# Patient Record
Sex: Female | Born: 1940 | Race: White | Hispanic: No | Smoking: Never smoker
Health system: Southern US, Community
[De-identification: ages and names within clinical notes are randomized; demographics above are authoritative.]

## PROBLEM LIST (undated history)

## (undated) DIAGNOSIS — M1990 Unspecified osteoarthritis, unspecified site: Secondary | ICD-10-CM

## (undated) DIAGNOSIS — E7800 Pure hypercholesterolemia, unspecified: Secondary | ICD-10-CM

## (undated) DIAGNOSIS — I639 Cerebral infarction, unspecified: Secondary | ICD-10-CM

## (undated) DIAGNOSIS — M810 Age-related osteoporosis without current pathological fracture: Secondary | ICD-10-CM

## (undated) DIAGNOSIS — I10 Essential (primary) hypertension: Secondary | ICD-10-CM

## (undated) DIAGNOSIS — N3281 Overactive bladder: Secondary | ICD-10-CM

## (undated) DIAGNOSIS — C50919 Malignant neoplasm of unspecified site of unspecified female breast: Secondary | ICD-10-CM

## (undated) DIAGNOSIS — C50912 Malignant neoplasm of unspecified site of left female breast: Secondary | ICD-10-CM

## (undated) DIAGNOSIS — T7840XA Allergy, unspecified, initial encounter: Secondary | ICD-10-CM

## (undated) HISTORY — DX: Unspecified osteoarthritis, unspecified site: M19.90

## (undated) HISTORY — PX: BREAST LUMPECTOMY: SHX2

## (undated) HISTORY — DX: Malignant neoplasm of unspecified site of left female breast: C50.912

## (undated) HISTORY — PX: ABDOMINAL HYSTERECTOMY: SHX81

## (undated) HISTORY — DX: Allergy, unspecified, initial encounter: T78.40XA

## (undated) HISTORY — DX: Overactive bladder: N32.81

## (undated) HISTORY — DX: Age-related osteoporosis without current pathological fracture: M81.0

## (undated) HISTORY — DX: Malignant neoplasm of unspecified site of unspecified female breast: C50.919

---

## 1999-01-14 ENCOUNTER — Other Ambulatory Visit: Admission: RE | Admit: 1999-01-14 | Discharge: 1999-01-14 | Payer: Self-pay | Admitting: *Deleted

## 2000-03-05 ENCOUNTER — Other Ambulatory Visit: Admission: RE | Admit: 2000-03-05 | Discharge: 2000-03-05 | Payer: Self-pay | Admitting: *Deleted

## 2001-04-07 ENCOUNTER — Other Ambulatory Visit: Admission: RE | Admit: 2001-04-07 | Discharge: 2001-04-07 | Payer: Self-pay | Admitting: *Deleted

## 2006-03-02 ENCOUNTER — Encounter: Admission: RE | Admit: 2006-03-02 | Discharge: 2006-03-02 | Payer: Self-pay | Admitting: Unknown Physician Specialty

## 2006-08-31 ENCOUNTER — Encounter: Admission: RE | Admit: 2006-08-31 | Discharge: 2006-08-31 | Payer: Self-pay | Admitting: Interventional Radiology

## 2006-09-07 ENCOUNTER — Encounter: Admission: RE | Admit: 2006-09-07 | Discharge: 2006-09-07 | Payer: Self-pay | Admitting: Interventional Radiology

## 2006-09-28 ENCOUNTER — Encounter: Admission: RE | Admit: 2006-09-28 | Discharge: 2006-09-28 | Payer: Self-pay | Admitting: Interventional Radiology

## 2007-02-27 IMAGING — US EM OFFICE/OP CONSULT LEVEL 3 (40)
1 series · 13 of 16 positions shown · non-contrast
Comparison: none

[Series 1: unknown · 13 of 24 slices shown]
[im 1/24]
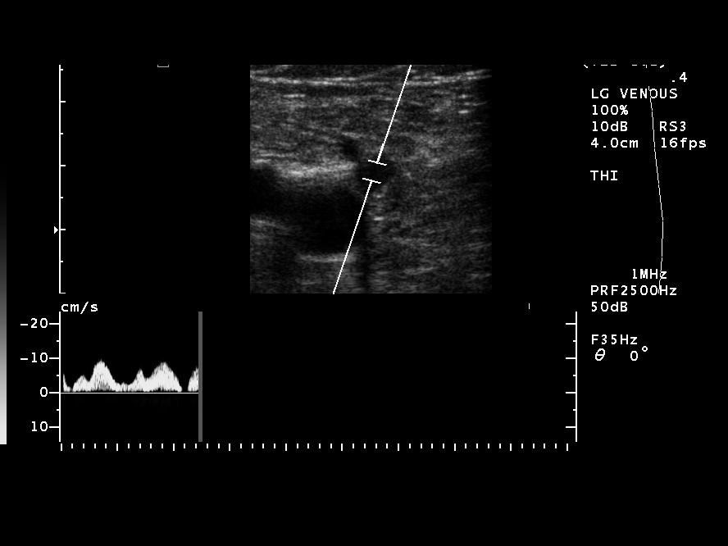
[im 2/24]
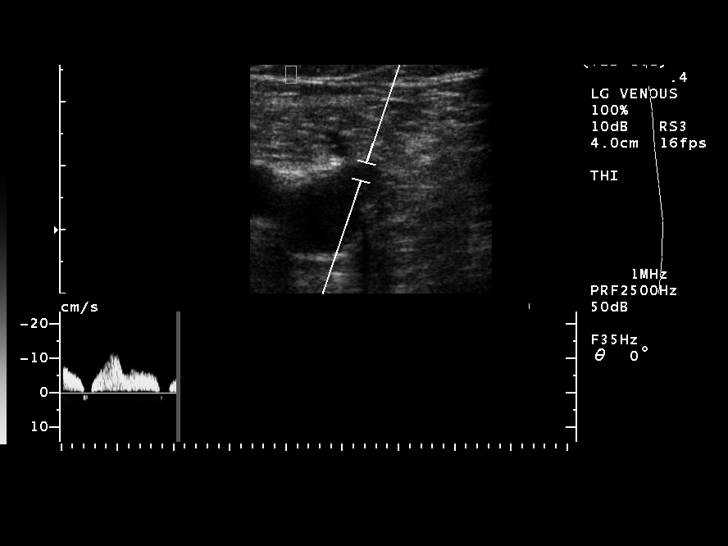
[im 5/24]
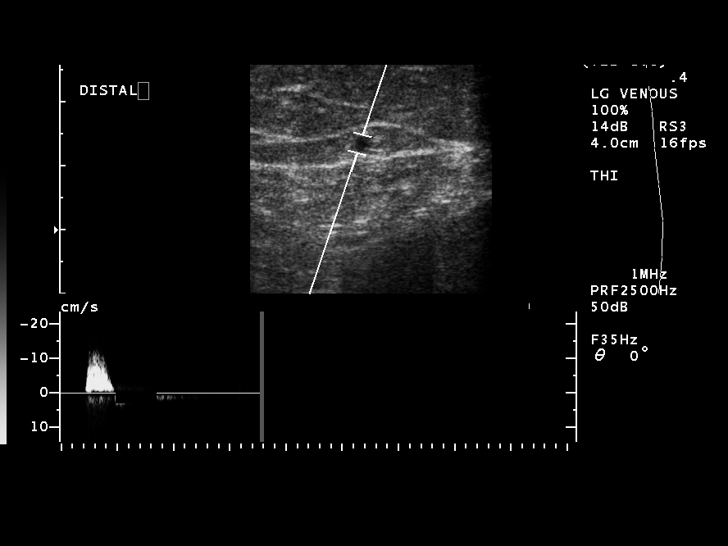
[im 7/24]
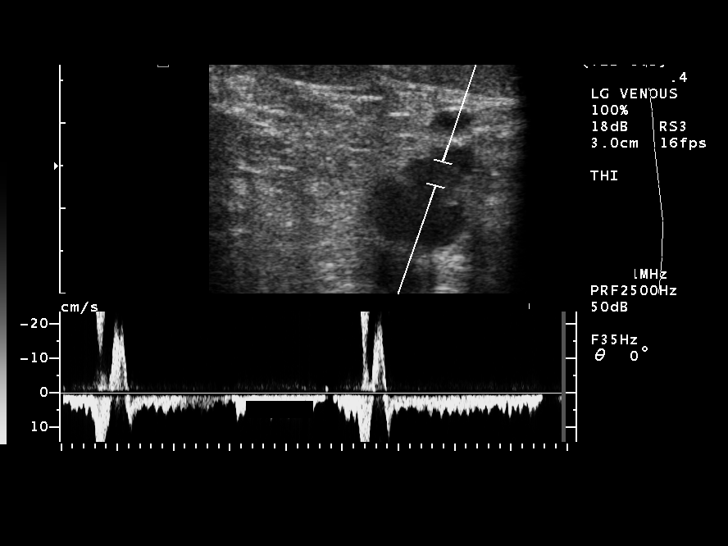
[im 8/24]
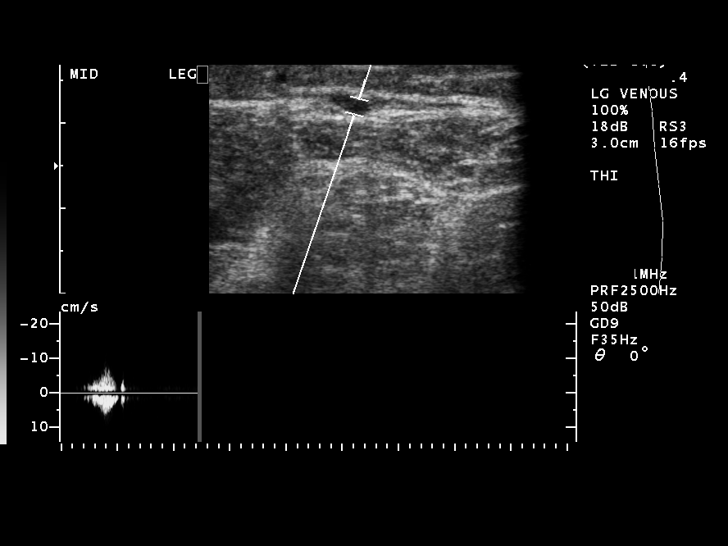
[im 10/24]
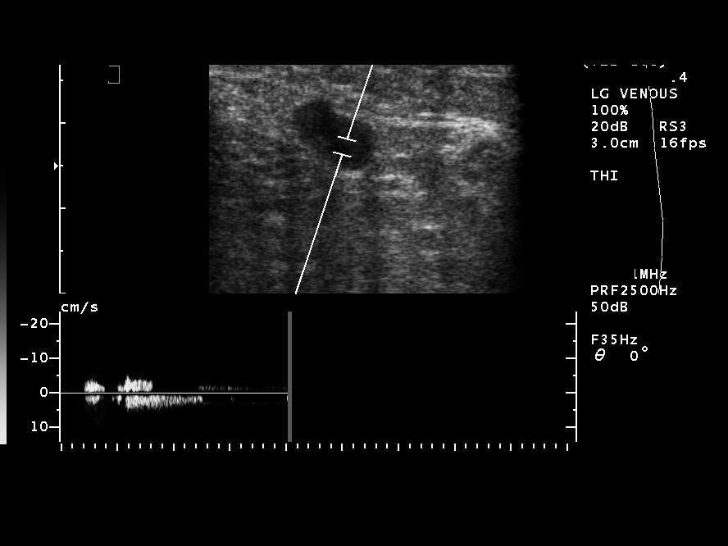
[im 13/24]
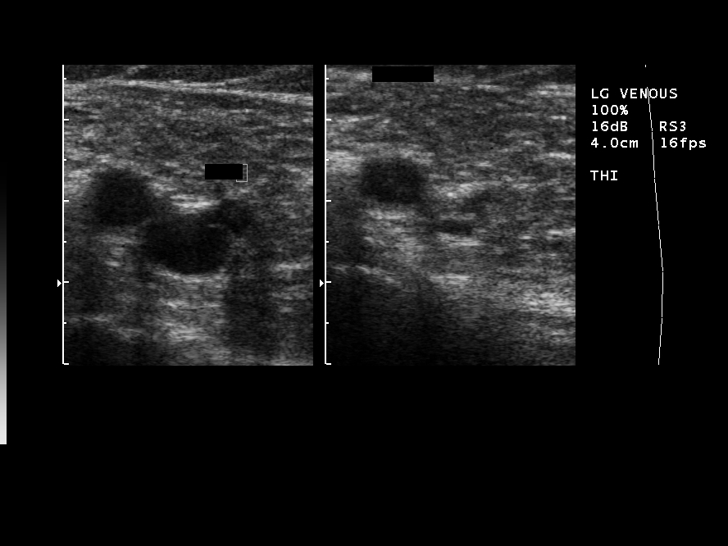
[im 14/24]
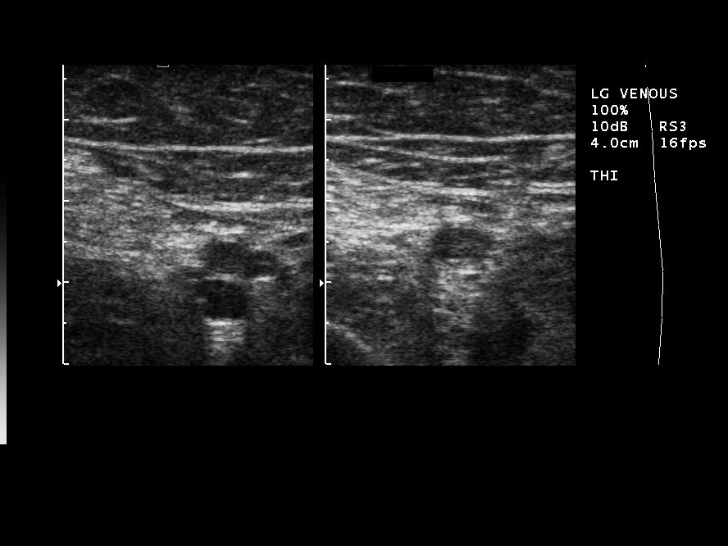
[im 16/24]
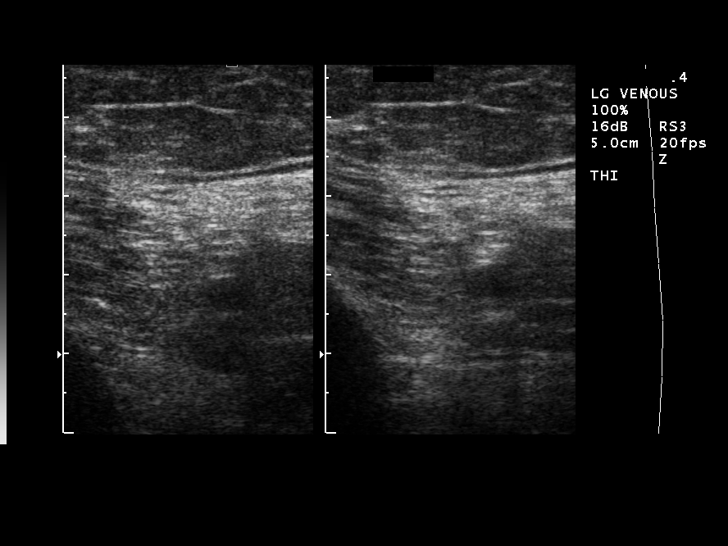
[im 17/24]
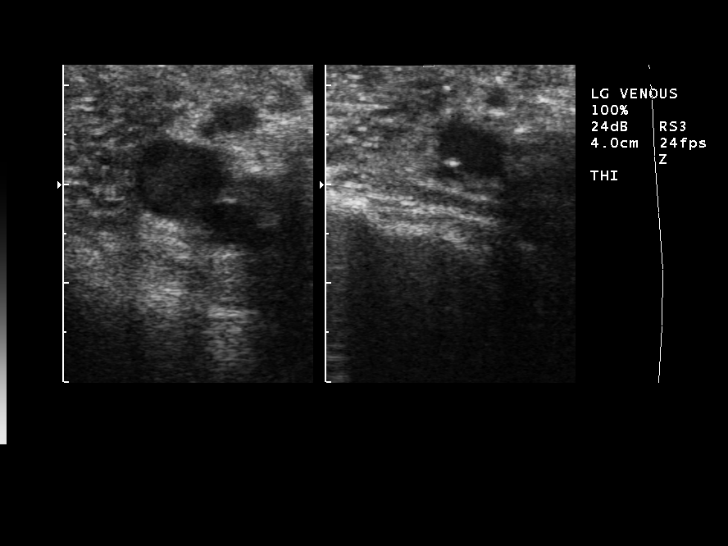
[im 19/24]
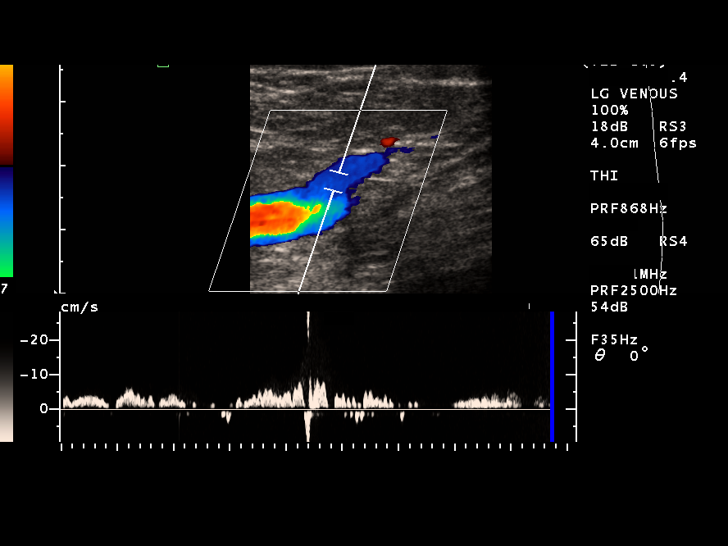
[im 22/24]
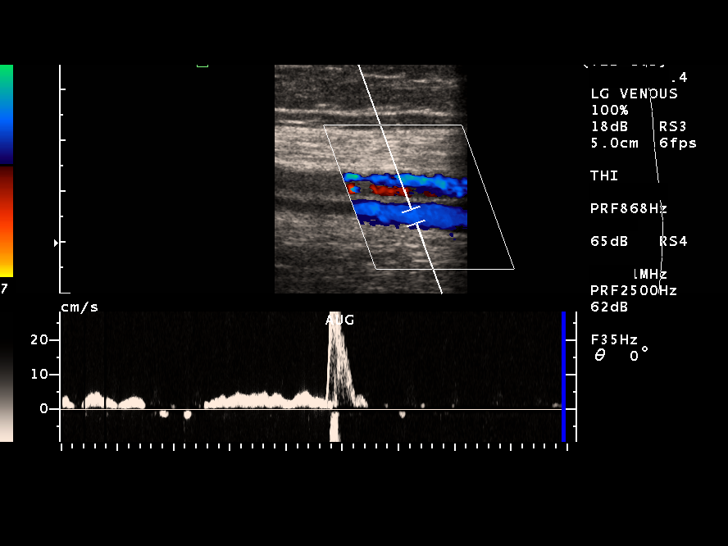
[im 24/24]
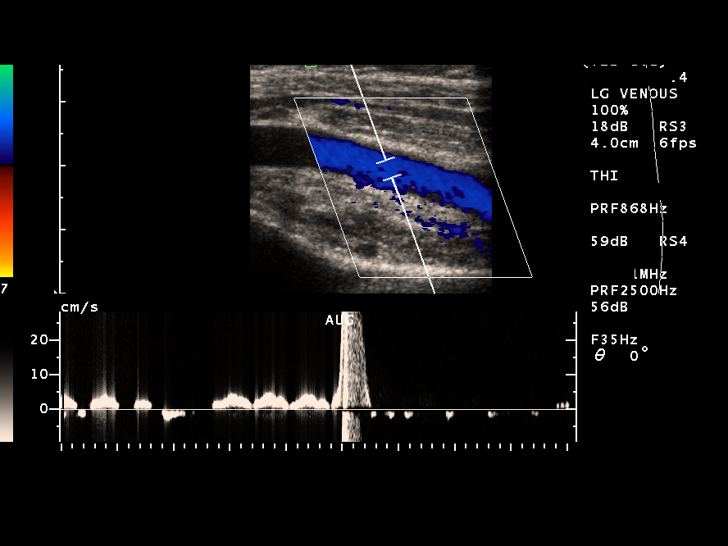

[13 of 16 positions shown; findings below may reference images not displayed]

OUTPATIENT CONSULTATION ? LEVEL III ? [DATE]

 [REDACTED] [facility]
 [address]

 RE:  Arissa Billiot (DOB ? 01/09/1941)

 Dear Dr. Caleb:  

 Thank you for referring Mrs. Jim for varicose vein evaluation.  Review of her venous history was performed.  She states she has had previous injection sclerotherapy with saline for spider veins.  She has no history of DVT or superficial thrombophlebitis.  She has a family history of varicose veins which affected her mother.  She currently is on hormone replacement therapy and takes Premarin.  She wears over-the-counter support stockings but not prescription strength graded compression hose.  She is retired.  She has a history of right hip osteoarthritis which limits her exercise.  She currently walks about 20 to 30 minutes at a time one to two times a week.  She describes worsening throbbing and achy type pain in the right calf posteriorly.  This pain has awakened her at night consistent with restless leg syndrome.  Her symptoms are worse throughout the day.  She previously was taking Celebrex for pain relief however now continues to take Ibuprofen 400mg bid approximately four times a week for pain relief.  

 On physical exam, she has medium size subsurface tortuous varicosities in the right lower thigh extending across the popliteal fossa onto the calf.  The left lower extremity is not affected.  

 Ultrasound was performed today.  Briefly this demonstrated right SSV venous insufficiency with an incompetent right SPJ.  The right GSV was normal.  There was no DVT.  

 Today, I spent approximately 45 minutes with Mrs. Jim and in detail reviewed the physical exam and ultrasound findings.  Our discussion centered around treatment of the right SSV to relieve the venous hypertension resulting in the varicose veins.  She understands the procedure, risks, benefits, goals, outcome, and recovery.  She does wish to proceed with insurance preauthorization which will be performed for her.  She was fitted today for thigh length prescription strength graded compression stockings (20 to 30mm of Hg).  

 Again, thank you for allowing me to participate in Mrs. Jim?Gambetta care.  I will keep you informed of her treatment and progress. 

 Tarla, Bambucafe, M.D.
 TSE:Jumper

 CC:  Dr. Boujema Gaouzi

## 2007-04-20 ENCOUNTER — Encounter: Admission: RE | Admit: 2007-04-20 | Discharge: 2007-04-20 | Payer: Self-pay | Admitting: Interventional Radiology

## 2007-09-25 IMAGING — US US EXTREM LOW VENOUS*R*
1 series · 14 of 24 positions shown · non-contrast
Comparison: none

CLINICAL DATA: One  month status post right SSV transcatheter laser occlusion for varicose veins.
RIGHT LOWER EXTREMITY VENOUS DOPPLER ULTRASOUND:
TECHNIQUE: Gray-scale sonography with compression as well as color and duplex Doppler ultrasound were performed to evaluate the deep venous system from the level of the common femoral vein through the popliteal and proximal calf veins.

[Series 1: unknown · 14 of 27 slices shown]
[im 1/27]
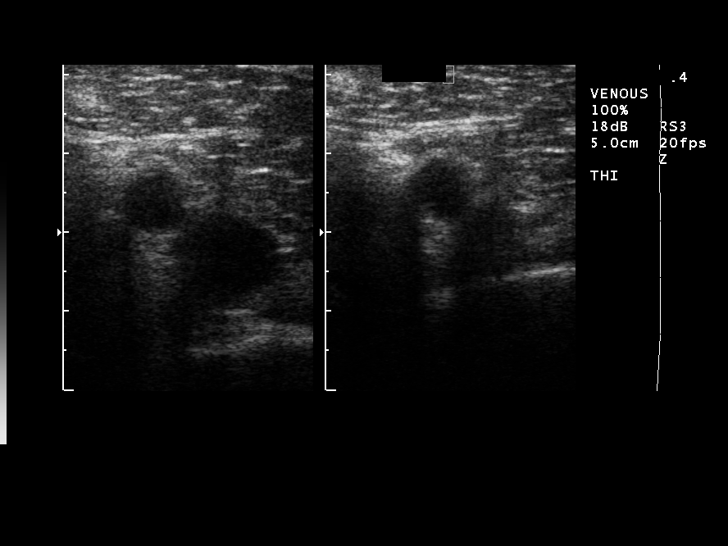
[im 3/27]
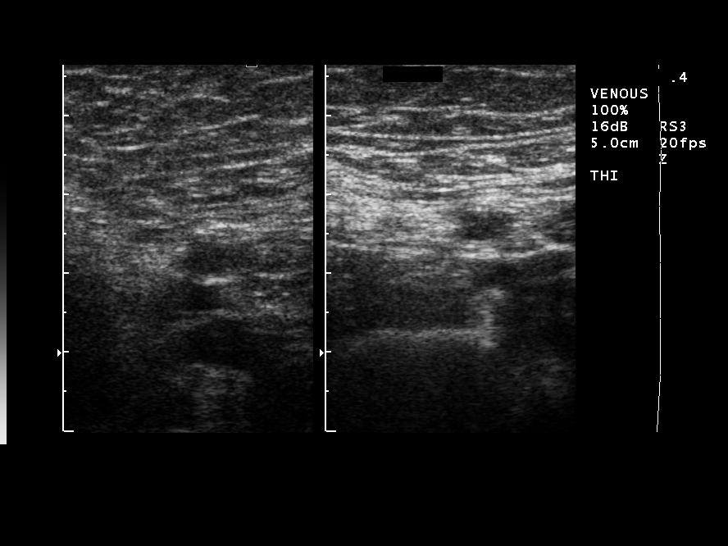
[im 5/27]
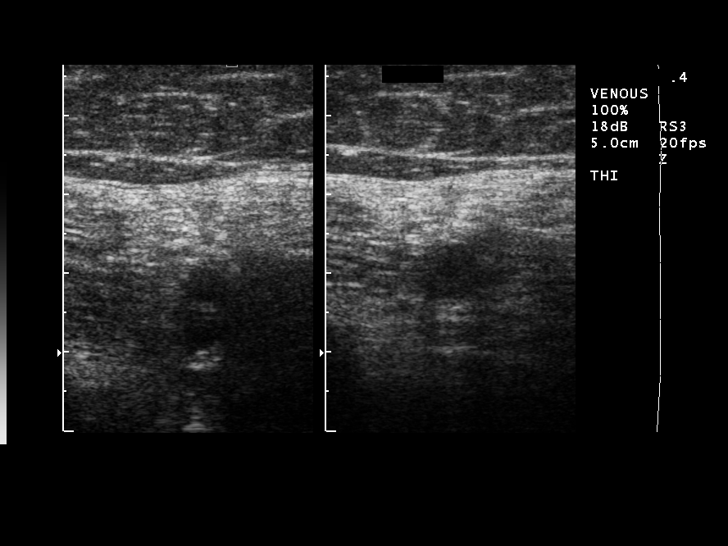
[im 7/27]
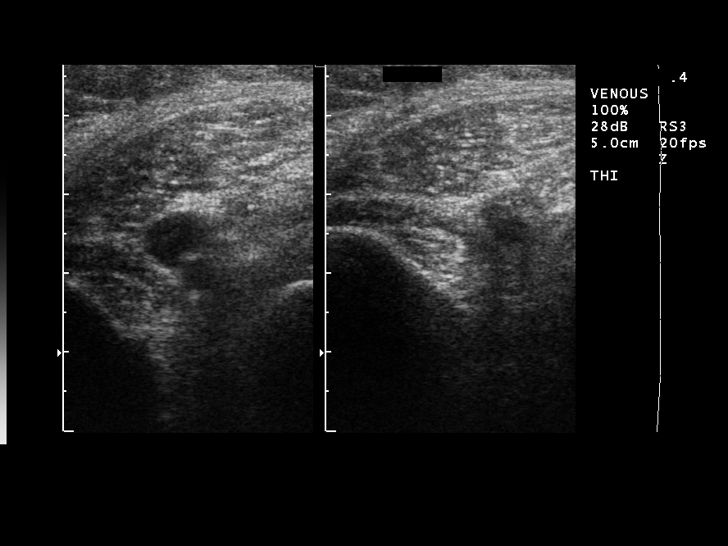
[im 8/27]
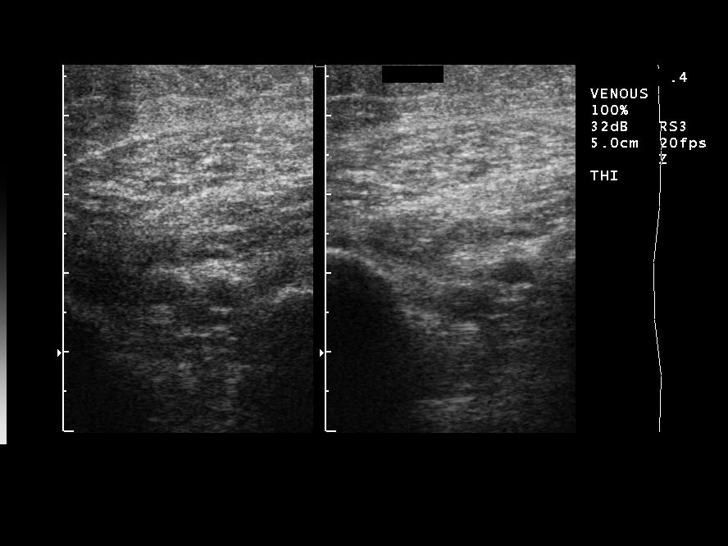
[im 11/27]
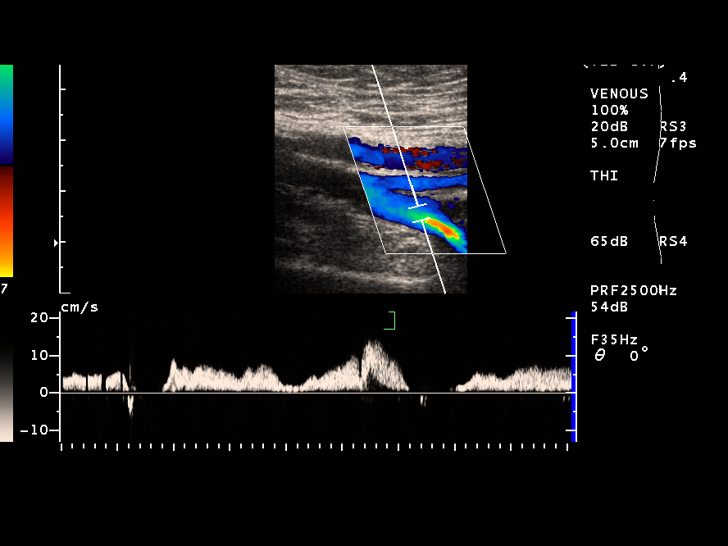
[im 13/27]
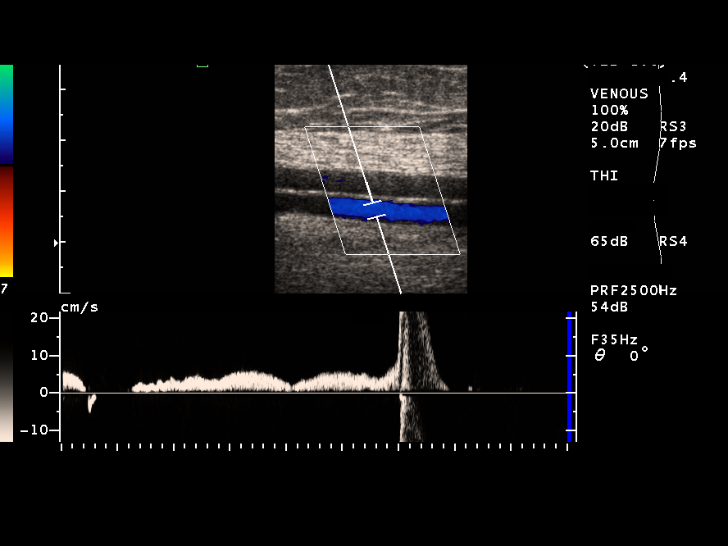
[im 14/27]
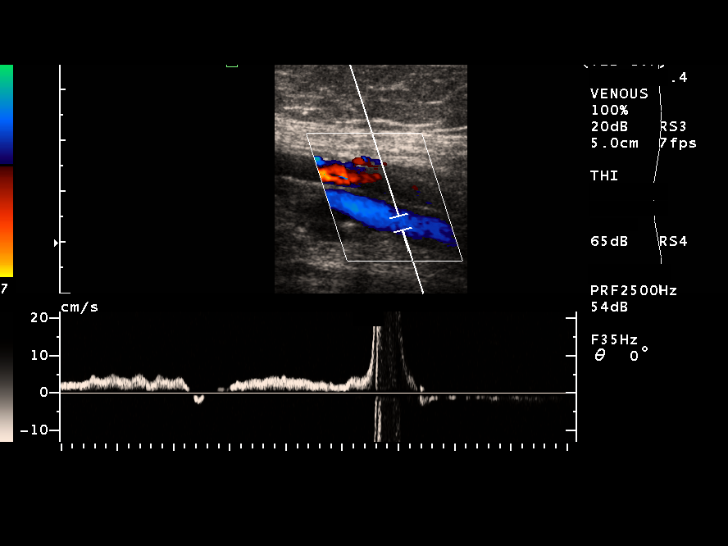
[im 16/27]
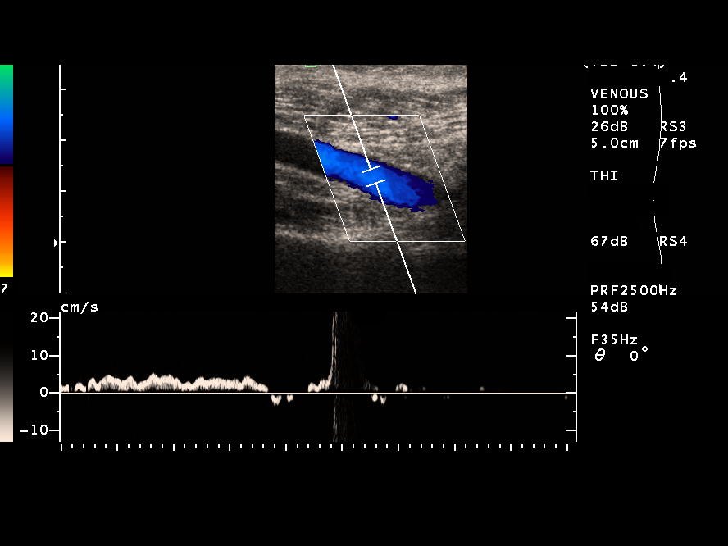
[im 19/27]
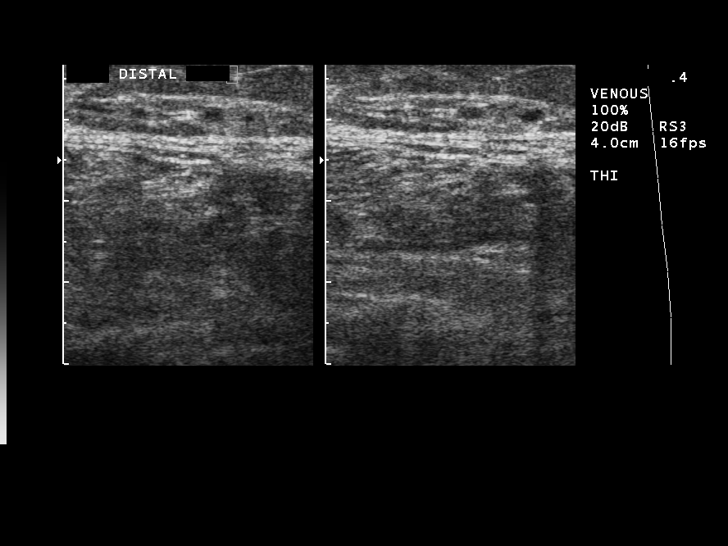
[im 21/27]
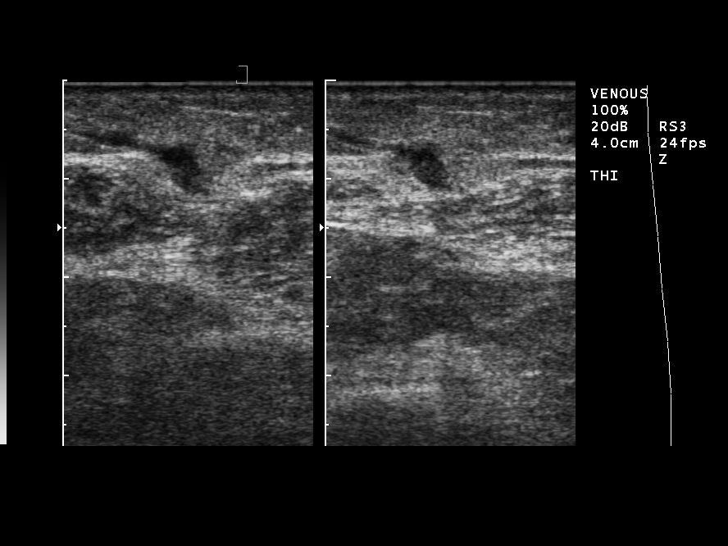
[im 22/27]
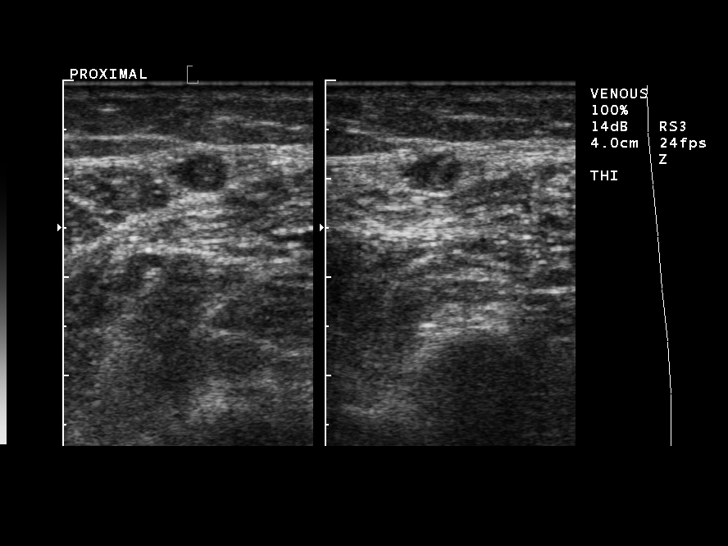
[im 24/27]
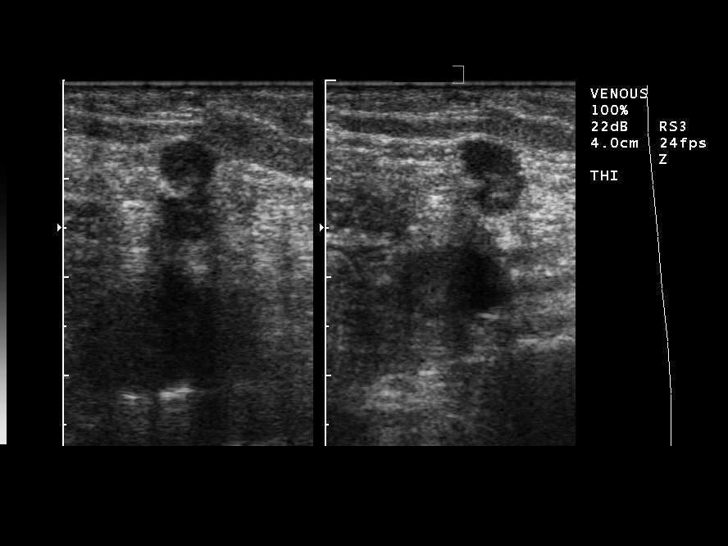
[im 27/27]
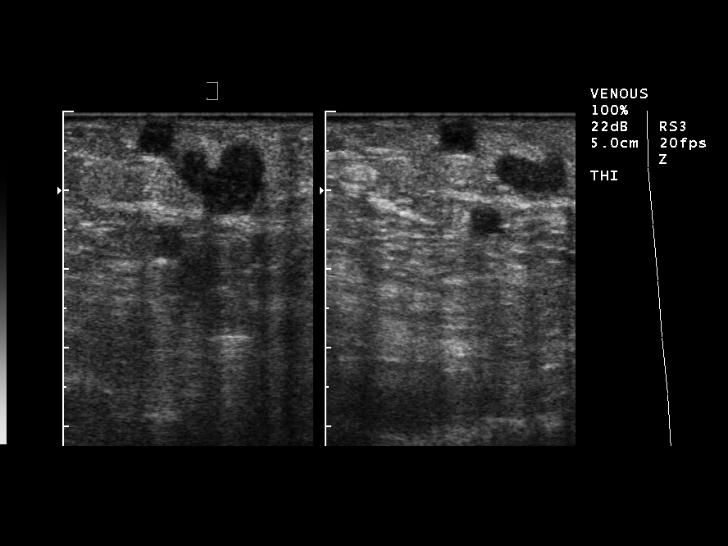

[14 of 24 positions shown; findings below may reference images not displayed]

FINDINGS: The right common femoral, femoral, and popliteal veins demonstrate normal compressibility, augmentation, and phasicity without DVT.  The treated segment of the right SSV from the right SPJ into the lower calf region remains occluded.  Varicosities in the popliteal fossa are also thrombosed.  No delayed recanalization.  No new varicose veins.
IMPRESSION: 1.  Treated right SSV segment remains occluded as well as the popliteal varicosities.  This is an expected finding at one month.
2.  No DVT.

## 2011-07-25 ENCOUNTER — Emergency Department (HOSPITAL_COMMUNITY)
Admission: EM | Admit: 2011-07-25 | Discharge: 2011-07-25 | Disposition: A | Discharge status: Home / Self Care | Payer: Medicare Other | Attending: Emergency Medicine | Admitting: Emergency Medicine

## 2011-07-25 ENCOUNTER — Emergency Department (HOSPITAL_COMMUNITY): Payer: Medicare Other

## 2011-07-25 DIAGNOSIS — S139XXA Sprain of joints and ligaments of unspecified parts of neck, initial encounter: Secondary | ICD-10-CM | POA: Insufficient documentation

## 2011-07-25 DIAGNOSIS — M129 Arthropathy, unspecified: Secondary | ICD-10-CM | POA: Insufficient documentation

## 2011-07-25 DIAGNOSIS — M542 Cervicalgia: Secondary | ICD-10-CM | POA: Insufficient documentation

## 2012-07-21 IMAGING — CT CT CERVICAL SPINE W/O CM
4 of 6 series · 14 of 33 positions shown, 16 images · non-contrast
Comparison: None.

CLINICAL DATA: Trauma.  And the with neck pain.

CT CERVICAL SPINE WITHOUT CONTRAST
TECHNIQUE: Multidetector CT imaging of the cervical spine was
performed. Multiplanar CT image reconstructions were also
generated.

[Series 2: cervical spine · axial · 0.28mm/px · z∈[-79,+6]mm · 3 of 69 slices shown, 4 images]
[im 18/69  soft-tissue]
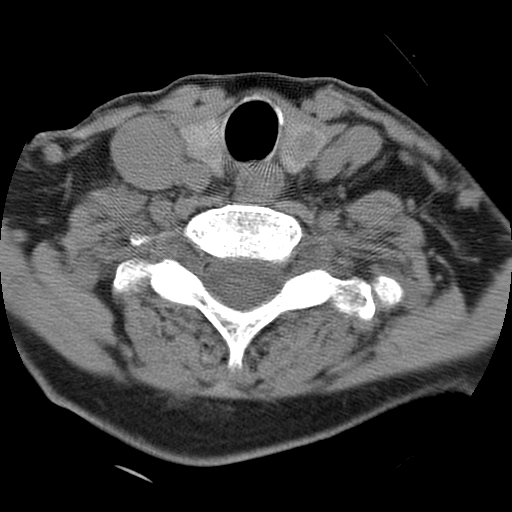
[im 18/69  bone]
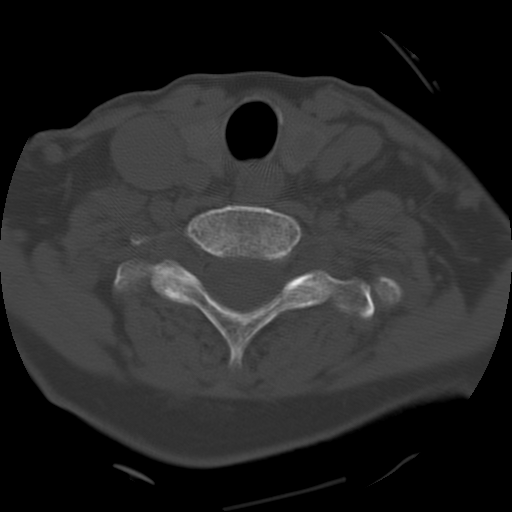
[im 35/69  bone]
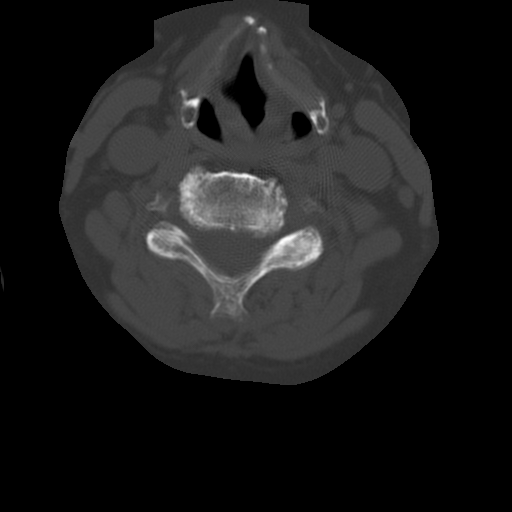
[im 52/69  bone]
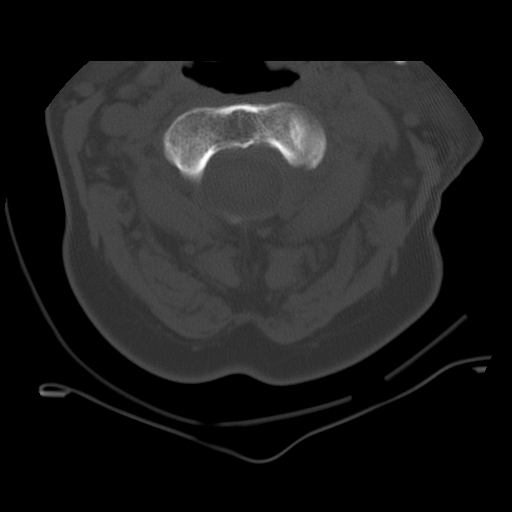

[Series 3: recon 2: cervical spine · axial · 0.31mm/px · z∈[-81,+4]mm · 3 of 69 slices shown]
[im 18/69  bone]
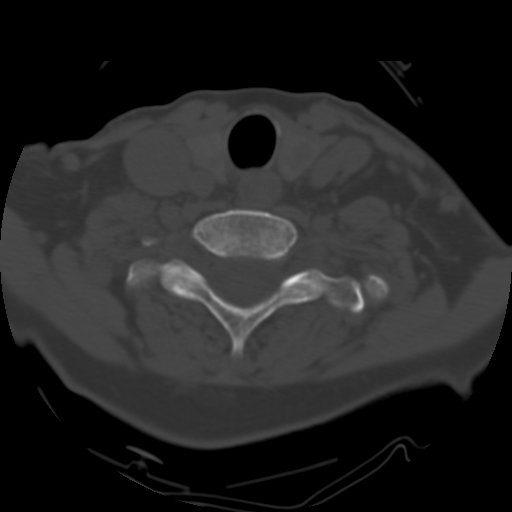
[im 35/69  bone]
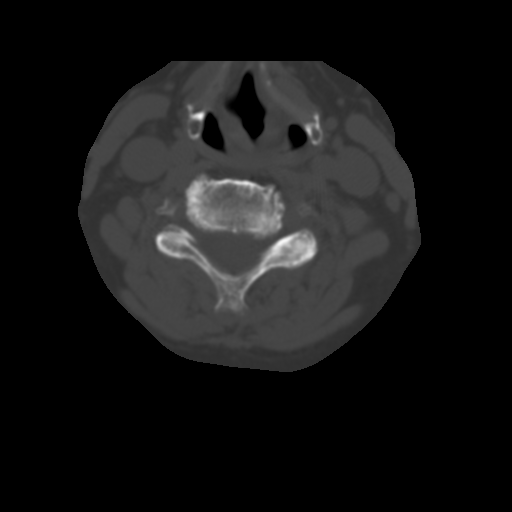
[im 52/69  bone]
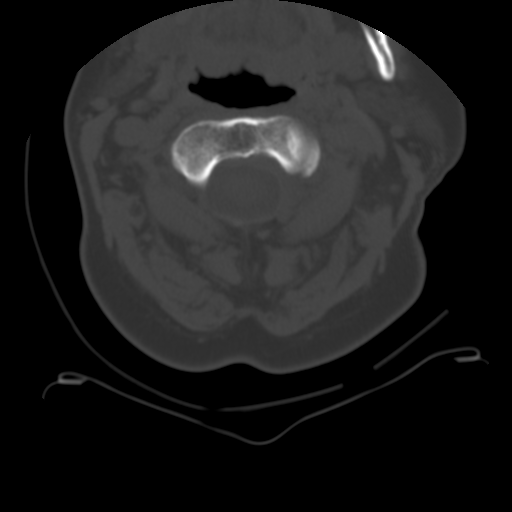

[Series 400: cor · coronal · 0.34mm/px · 3 of 44 slices shown]
[im 9/44  bone]
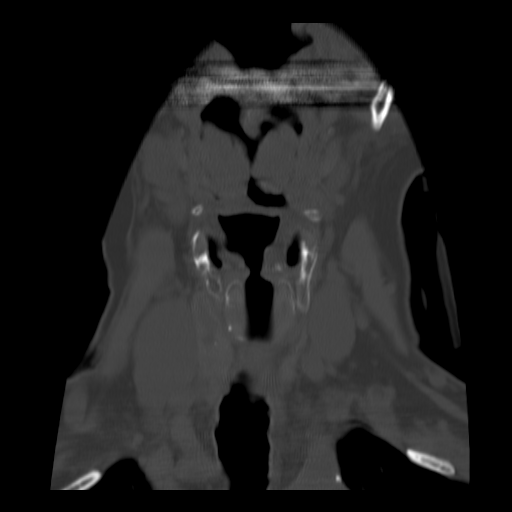
[im 18/44  bone]
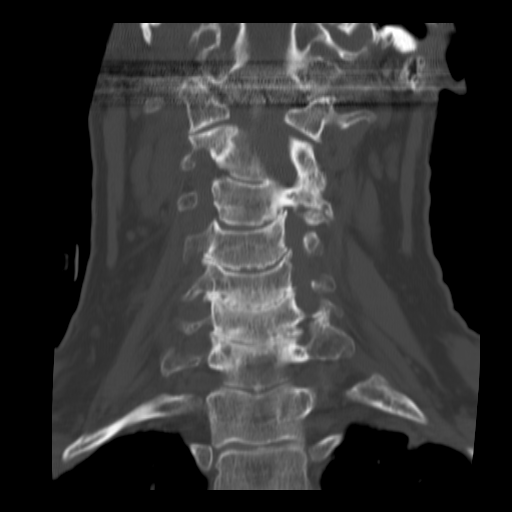
[im 26/44  bone]
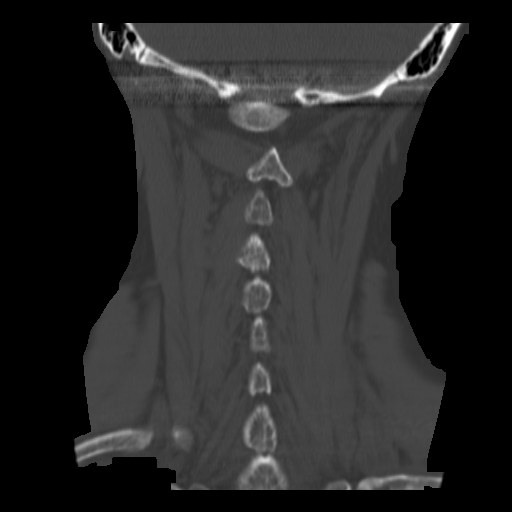

[Series 401: sag · sagittal · 0.34mm/px · 5 of 37 slices shown, 6 images]
[im 13/37  bone]
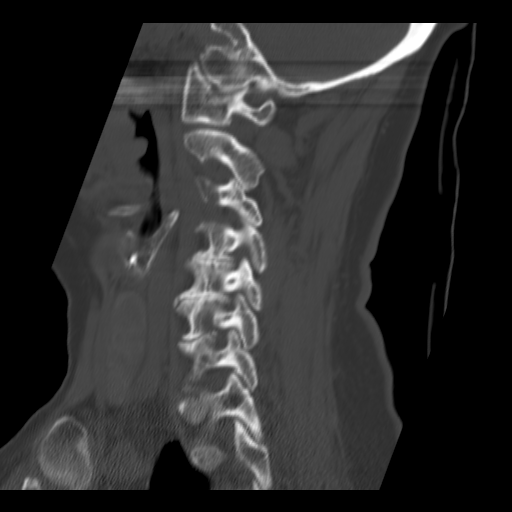
[im 16/37  bone]
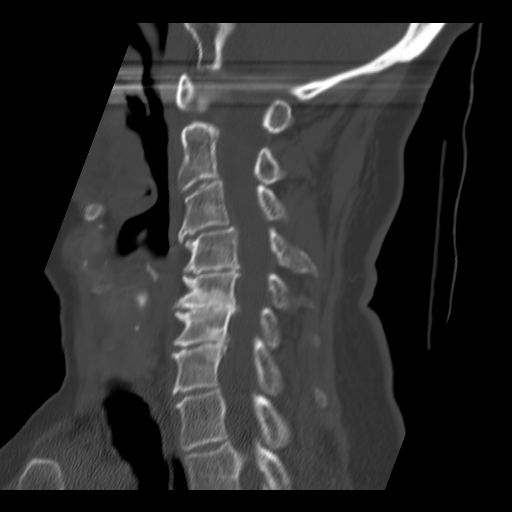
[im 19/37  soft-tissue]
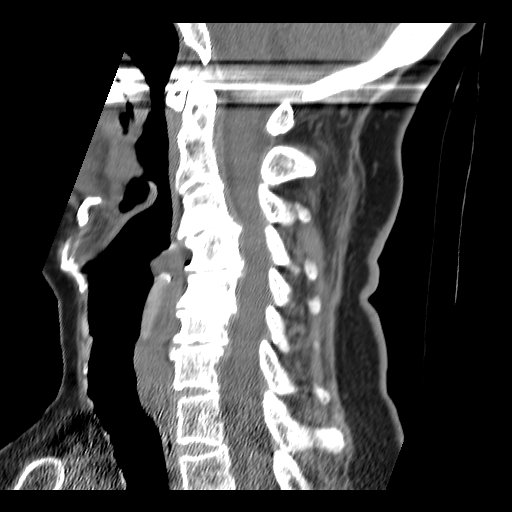
[im 19/37  bone]
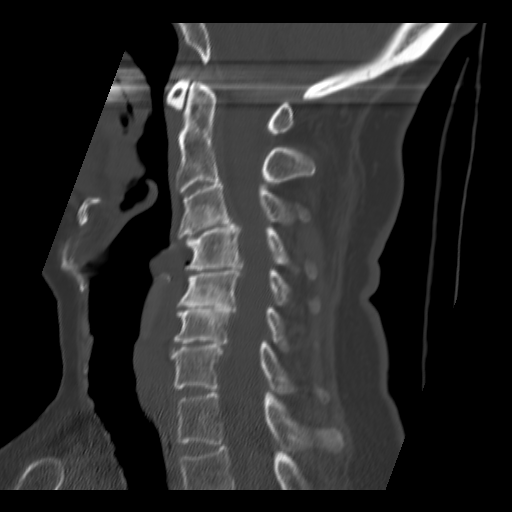
[im 22/37  bone]
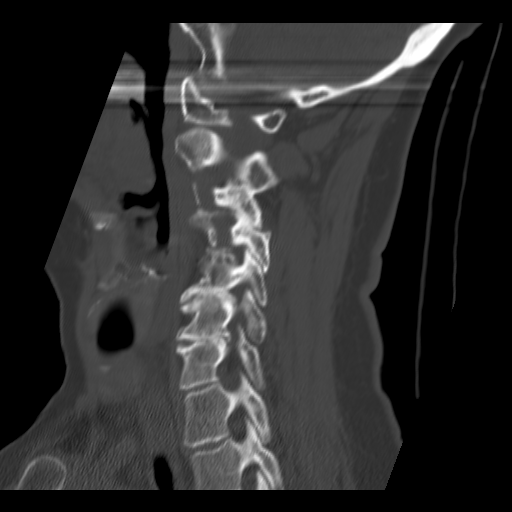
[im 25/37  bone]
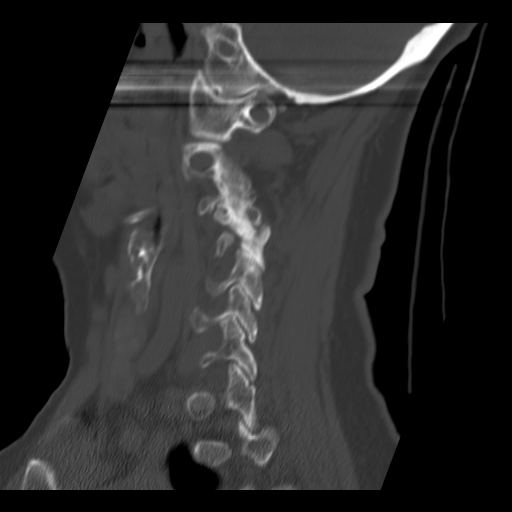

[14 of 33 positions shown; findings below may reference images not displayed]

FINDINGS: Imaging was obtained from the skull base through the T1-2
interspace.  No evidence for acute fracture.  There is marked loss
of disc height at all cervical levels.  Trace anterolisthesis of C3
on 4 is probably related to the facet osteoarthropathy.

There is reversal of the normal cervical lordosis.  No evidence for
prevertebral soft tissue swelling.

Multiple small bilateral thyroid nodules are evident.
IMPRESSION: No evidence for acute fracture in the cervical spine.

Diffuse degenerative disc and facet disease.

Multiple small bilateral thyroid nodules.  Ultrasound followup
could be used to further characterized as clinically warranted.

## 2013-01-10 ENCOUNTER — Encounter (INDEPENDENT_AMBULATORY_CARE_PROVIDER_SITE_OTHER): Payer: Medicare Other | Admitting: Internal Medicine

## 2013-01-10 DIAGNOSIS — M899 Disorder of bone, unspecified: Secondary | ICD-10-CM

## 2013-01-10 DIAGNOSIS — C50919 Malignant neoplasm of unspecified site of unspecified female breast: Secondary | ICD-10-CM

## 2013-01-10 DIAGNOSIS — E2839 Other primary ovarian failure: Secondary | ICD-10-CM

## 2013-01-10 DIAGNOSIS — M949 Disorder of cartilage, unspecified: Secondary | ICD-10-CM

## 2013-02-08 ENCOUNTER — Encounter (INDEPENDENT_AMBULATORY_CARE_PROVIDER_SITE_OTHER): Payer: Medicare Other | Admitting: Internal Medicine

## 2013-02-08 DIAGNOSIS — M810 Age-related osteoporosis without current pathological fracture: Secondary | ICD-10-CM

## 2013-02-08 DIAGNOSIS — C50919 Malignant neoplasm of unspecified site of unspecified female breast: Secondary | ICD-10-CM

## 2013-02-08 DIAGNOSIS — E2839 Other primary ovarian failure: Secondary | ICD-10-CM

## 2013-06-08 ENCOUNTER — Encounter (INDEPENDENT_AMBULATORY_CARE_PROVIDER_SITE_OTHER): Payer: Medicare Other

## 2013-06-08 DIAGNOSIS — C50919 Malignant neoplasm of unspecified site of unspecified female breast: Secondary | ICD-10-CM

## 2013-10-19 ENCOUNTER — Encounter (INDEPENDENT_AMBULATORY_CARE_PROVIDER_SITE_OTHER): Payer: Medicare Other

## 2013-10-19 DIAGNOSIS — C50919 Malignant neoplasm of unspecified site of unspecified female breast: Secondary | ICD-10-CM

## 2013-10-19 DIAGNOSIS — R339 Retention of urine, unspecified: Secondary | ICD-10-CM

## 2014-12-12 DIAGNOSIS — Z9889 Other specified postprocedural states: Secondary | ICD-10-CM | POA: Diagnosis not present

## 2014-12-12 DIAGNOSIS — C50912 Malignant neoplasm of unspecified site of left female breast: Secondary | ICD-10-CM | POA: Diagnosis not present

## 2014-12-12 DIAGNOSIS — R928 Other abnormal and inconclusive findings on diagnostic imaging of breast: Secondary | ICD-10-CM | POA: Diagnosis not present

## 2014-12-27 DIAGNOSIS — C50112 Malignant neoplasm of central portion of left female breast: Secondary | ICD-10-CM | POA: Diagnosis not present

## 2014-12-27 DIAGNOSIS — M810 Age-related osteoporosis without current pathological fracture: Secondary | ICD-10-CM | POA: Diagnosis not present

## 2014-12-27 DIAGNOSIS — Z5181 Encounter for therapeutic drug level monitoring: Secondary | ICD-10-CM | POA: Diagnosis not present

## 2014-12-27 DIAGNOSIS — E2839 Other primary ovarian failure: Secondary | ICD-10-CM | POA: Diagnosis not present

## 2015-01-10 DIAGNOSIS — R3915 Urgency of urination: Secondary | ICD-10-CM | POA: Diagnosis not present

## 2015-01-10 DIAGNOSIS — K5901 Slow transit constipation: Secondary | ICD-10-CM | POA: Diagnosis not present

## 2015-01-10 DIAGNOSIS — N390 Urinary tract infection, site not specified: Secondary | ICD-10-CM | POA: Diagnosis not present

## 2015-01-11 DIAGNOSIS — M810 Age-related osteoporosis without current pathological fracture: Secondary | ICD-10-CM | POA: Diagnosis not present

## 2015-01-15 DIAGNOSIS — M810 Age-related osteoporosis without current pathological fracture: Secondary | ICD-10-CM | POA: Diagnosis not present

## 2015-01-15 DIAGNOSIS — E2839 Other primary ovarian failure: Secondary | ICD-10-CM | POA: Diagnosis not present

## 2015-02-19 DIAGNOSIS — K5901 Slow transit constipation: Secondary | ICD-10-CM | POA: Diagnosis not present

## 2015-02-19 DIAGNOSIS — R3915 Urgency of urination: Secondary | ICD-10-CM | POA: Diagnosis not present

## 2015-04-04 DIAGNOSIS — B078 Other viral warts: Secondary | ICD-10-CM | POA: Diagnosis not present

## 2015-04-04 DIAGNOSIS — Z1283 Encounter for screening for malignant neoplasm of skin: Secondary | ICD-10-CM | POA: Diagnosis not present

## 2015-04-04 DIAGNOSIS — L570 Actinic keratosis: Secondary | ICD-10-CM | POA: Diagnosis not present

## 2015-07-03 DIAGNOSIS — Z5181 Encounter for therapeutic drug level monitoring: Secondary | ICD-10-CM | POA: Diagnosis not present

## 2015-07-03 DIAGNOSIS — C50412 Malignant neoplasm of upper-outer quadrant of left female breast: Secondary | ICD-10-CM | POA: Diagnosis not present

## 2015-07-03 DIAGNOSIS — C50912 Malignant neoplasm of unspecified site of left female breast: Secondary | ICD-10-CM | POA: Diagnosis not present

## 2015-07-03 DIAGNOSIS — Z79899 Other long term (current) drug therapy: Secondary | ICD-10-CM | POA: Diagnosis not present

## 2015-07-03 DIAGNOSIS — M810 Age-related osteoporosis without current pathological fracture: Secondary | ICD-10-CM | POA: Diagnosis not present

## 2015-09-02 DIAGNOSIS — M810 Age-related osteoporosis without current pathological fracture: Secondary | ICD-10-CM | POA: Diagnosis not present

## 2015-09-02 DIAGNOSIS — M1990 Unspecified osteoarthritis, unspecified site: Secondary | ICD-10-CM | POA: Diagnosis not present

## 2015-09-02 DIAGNOSIS — E7800 Pure hypercholesterolemia, unspecified: Secondary | ICD-10-CM | POA: Diagnosis not present

## 2015-09-02 DIAGNOSIS — I10 Essential (primary) hypertension: Secondary | ICD-10-CM | POA: Diagnosis not present

## 2015-09-02 DIAGNOSIS — F419 Anxiety disorder, unspecified: Secondary | ICD-10-CM | POA: Diagnosis not present

## 2015-09-09 DIAGNOSIS — I10 Essential (primary) hypertension: Secondary | ICD-10-CM | POA: Diagnosis not present

## 2015-09-09 DIAGNOSIS — Z1389 Encounter for screening for other disorder: Secondary | ICD-10-CM | POA: Diagnosis not present

## 2015-09-09 DIAGNOSIS — Z23 Encounter for immunization: Secondary | ICD-10-CM | POA: Diagnosis not present

## 2015-09-09 DIAGNOSIS — Z0001 Encounter for general adult medical examination with abnormal findings: Secondary | ICD-10-CM | POA: Diagnosis not present

## 2015-09-17 DIAGNOSIS — M6281 Muscle weakness (generalized): Secondary | ICD-10-CM | POA: Diagnosis not present

## 2015-09-17 DIAGNOSIS — M545 Low back pain: Secondary | ICD-10-CM | POA: Diagnosis not present

## 2015-09-23 DIAGNOSIS — M545 Low back pain: Secondary | ICD-10-CM | POA: Diagnosis not present

## 2015-09-23 DIAGNOSIS — M6281 Muscle weakness (generalized): Secondary | ICD-10-CM | POA: Diagnosis not present

## 2015-10-01 DIAGNOSIS — M6281 Muscle weakness (generalized): Secondary | ICD-10-CM | POA: Diagnosis not present

## 2015-10-01 DIAGNOSIS — M545 Low back pain: Secondary | ICD-10-CM | POA: Diagnosis not present

## 2015-10-14 DIAGNOSIS — M6281 Muscle weakness (generalized): Secondary | ICD-10-CM | POA: Diagnosis not present

## 2015-10-14 DIAGNOSIS — M545 Low back pain: Secondary | ICD-10-CM | POA: Diagnosis not present

## 2015-10-21 DIAGNOSIS — M6281 Muscle weakness (generalized): Secondary | ICD-10-CM | POA: Diagnosis not present

## 2015-10-21 DIAGNOSIS — M545 Low back pain: Secondary | ICD-10-CM | POA: Diagnosis not present

## 2015-12-18 DIAGNOSIS — C50412 Malignant neoplasm of upper-outer quadrant of left female breast: Secondary | ICD-10-CM | POA: Diagnosis not present

## 2015-12-18 DIAGNOSIS — Z9889 Other specified postprocedural states: Secondary | ICD-10-CM | POA: Diagnosis not present

## 2015-12-18 DIAGNOSIS — R928 Other abnormal and inconclusive findings on diagnostic imaging of breast: Secondary | ICD-10-CM | POA: Diagnosis not present

## 2016-01-15 DIAGNOSIS — S29012A Strain of muscle and tendon of back wall of thorax, initial encounter: Secondary | ICD-10-CM | POA: Diagnosis not present

## 2016-02-03 DIAGNOSIS — Z79899 Other long term (current) drug therapy: Secondary | ICD-10-CM | POA: Diagnosis not present

## 2016-02-03 DIAGNOSIS — Z79810 Long term (current) use of selective estrogen receptor modulators (SERMs): Secondary | ICD-10-CM | POA: Diagnosis not present

## 2016-02-03 DIAGNOSIS — Z780 Asymptomatic menopausal state: Secondary | ICD-10-CM | POA: Diagnosis not present

## 2016-02-03 DIAGNOSIS — C50919 Malignant neoplasm of unspecified site of unspecified female breast: Secondary | ICD-10-CM | POA: Diagnosis not present

## 2016-02-03 DIAGNOSIS — M549 Dorsalgia, unspecified: Secondary | ICD-10-CM | POA: Diagnosis not present

## 2016-02-03 DIAGNOSIS — M810 Age-related osteoporosis without current pathological fracture: Secondary | ICD-10-CM | POA: Diagnosis not present

## 2016-02-03 DIAGNOSIS — E28319 Asymptomatic premature menopause: Secondary | ICD-10-CM | POA: Diagnosis not present

## 2016-02-07 DIAGNOSIS — M810 Age-related osteoporosis without current pathological fracture: Secondary | ICD-10-CM | POA: Diagnosis not present

## 2016-02-07 DIAGNOSIS — Z79899 Other long term (current) drug therapy: Secondary | ICD-10-CM | POA: Diagnosis not present

## 2016-02-07 DIAGNOSIS — Z5181 Encounter for therapeutic drug level monitoring: Secondary | ICD-10-CM | POA: Diagnosis not present

## 2016-02-07 DIAGNOSIS — C50912 Malignant neoplasm of unspecified site of left female breast: Secondary | ICD-10-CM | POA: Diagnosis not present

## 2016-03-31 DIAGNOSIS — B078 Other viral warts: Secondary | ICD-10-CM | POA: Diagnosis not present

## 2016-03-31 DIAGNOSIS — L820 Inflamed seborrheic keratosis: Secondary | ICD-10-CM | POA: Diagnosis not present

## 2016-03-31 DIAGNOSIS — L659 Nonscarring hair loss, unspecified: Secondary | ICD-10-CM | POA: Diagnosis not present

## 2016-03-31 DIAGNOSIS — Z1283 Encounter for screening for malignant neoplasm of skin: Secondary | ICD-10-CM | POA: Diagnosis not present

## 2016-06-03 DIAGNOSIS — H52223 Regular astigmatism, bilateral: Secondary | ICD-10-CM | POA: Diagnosis not present

## 2016-06-03 DIAGNOSIS — H25813 Combined forms of age-related cataract, bilateral: Secondary | ICD-10-CM | POA: Diagnosis not present

## 2016-06-03 DIAGNOSIS — H00029 Hordeolum internum unspecified eye, unspecified eyelid: Secondary | ICD-10-CM | POA: Diagnosis not present

## 2016-06-03 DIAGNOSIS — H5203 Hypermetropia, bilateral: Secondary | ICD-10-CM | POA: Diagnosis not present

## 2016-07-27 DIAGNOSIS — H2511 Age-related nuclear cataract, right eye: Secondary | ICD-10-CM | POA: Diagnosis not present

## 2016-07-27 DIAGNOSIS — H2513 Age-related nuclear cataract, bilateral: Secondary | ICD-10-CM | POA: Diagnosis not present

## 2016-08-04 DIAGNOSIS — Z23 Encounter for immunization: Secondary | ICD-10-CM | POA: Diagnosis not present

## 2016-08-11 ENCOUNTER — Encounter (HOSPITAL_COMMUNITY): Payer: Self-pay | Admitting: Hematology & Oncology

## 2016-08-11 ENCOUNTER — Other Ambulatory Visit (HOSPITAL_COMMUNITY): Payer: Self-pay | Admitting: Hematology & Oncology

## 2016-08-11 ENCOUNTER — Encounter (HOSPITAL_COMMUNITY): Payer: Medicare Other | Attending: Hematology & Oncology | Admitting: Hematology & Oncology

## 2016-08-11 VITALS — HR 106 | Temp 98.1°F | Resp 16 | Ht 60.0 in | Wt 147.6 lb

## 2016-08-11 DIAGNOSIS — M810 Age-related osteoporosis without current pathological fracture: Secondary | ICD-10-CM

## 2016-08-11 DIAGNOSIS — C50912 Malignant neoplasm of unspecified site of left female breast: Secondary | ICD-10-CM

## 2016-08-11 DIAGNOSIS — Z170 Estrogen receptor positive status [ER+]: Secondary | ICD-10-CM | POA: Diagnosis not present

## 2016-08-11 DIAGNOSIS — M79672 Pain in left foot: Secondary | ICD-10-CM | POA: Diagnosis not present

## 2016-08-11 DIAGNOSIS — M25579 Pain in unspecified ankle and joints of unspecified foot: Secondary | ICD-10-CM | POA: Diagnosis not present

## 2016-08-11 DIAGNOSIS — M79671 Pain in right foot: Secondary | ICD-10-CM | POA: Diagnosis not present

## 2016-08-11 DIAGNOSIS — Z5181 Encounter for therapeutic drug level monitoring: Secondary | ICD-10-CM

## 2016-08-11 DIAGNOSIS — Z79810 Long term (current) use of selective estrogen receptor modulators (SERMs): Secondary | ICD-10-CM

## 2016-08-11 HISTORY — DX: Malignant neoplasm of unspecified site of left female breast: C50.912

## 2016-08-11 NOTE — Progress Notes (Signed)
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 female is here to establish ongoing care for a diagnosis of ER+ HER 2 - left breast cancer. She had a left breast lumpectomy with sentinel node biopsy on 12/21/2012. She takes 20 mg Tamoxifen. No radiation therapy. Her last mammogram was December 18, 2015.  [name] has had a hysterectomy.   Patient says she has hot flashes, but she does not know if that is from the Tamoxifen or if it is because her house is too hot.  [name] experiences foot pain which her doctor attributed to aging. Patient denies reflux. She no longer takes a probiotic everyday.  She has cataract surgery on 08/22/2016.  She sees a Paediatric nurse every year.   She does not need any refills.   She notes that overall she feels well. She has no major complaints. She takes calcium and vitamin D daily for her bones. She notes that at one point she was on denosumab, this is confirmed in her medical records. She discontinued secondary to cost. She is on fosamax.   MEDICAL HISTORY:  Past Medical History:  Diagnosis Date  . Allergy   . Arthritis   . Breast cancer ([place])   . Osteoporosis   . Overactive bladder      SURGICAL HISTORY: Past Surgical History:  Procedure Laterality Date  . BREAST LUMPECTOMY      SOCIAL HISTORY: Social History   Social History  . Marital status:  Married    Spouse name: N/A  . Number of children: N/A  . Years of education: N/A   Occupational History  . Not on file.   Social History Main Topics  . Smoking status: Never Smoker  . Smokeless tobacco: Never Used  . Alcohol use Yes     Comment: once a month glass of wine with supper  . Drug use: No  . Sexual activity: Not Currently   Other Topics Concern  . Not on file   Social History Narrative  . No narrative on file   Married 55 years. 1 child, 2 grandchildren. She was an Best boy. She smoked socially. She loved to work outside in the yard, but that causes her pain now because of osteoporesis. She loves interior decoration.  FAMILY HISTORY: Family History  Problem Relation Age of Onset  . Cancer Mother     melanoma  . Heart disease Father   . Heart disease Brother   . Heart disease Brother    Mother passed away from melanoma at 55 years old. Father passed away from heart attack at 41 years old.  Brother passed away from heart attack at 11 years old. Brother passed away from heart attack at 75 years old. No living siblings  ALLERGIES:  has No Known Allergies.  MEDICATIONS:  Current Outpatient Prescriptions  Medication Sig  Dispense Refill  . acetaminophen (TYLENOL) 500 MG tablet Take by mouth.    [name] alendronate (FOSAMAX) 70 MG tablet Take by mouth.    . Ascorbic Acid (VITAMIN C) 1000 MG tablet Take by mouth.    [name] b complex vitamins tablet Take by mouth.    . Biotin 1 MG CAPS Take by mouth.    . Calcium Carbonate-Vitamin D 600-400 MG-UNIT tablet Take by mouth.    . clorazepate (TRANXENE) 7.5 MG tablet Take by mouth.    . fexofenadine (ALLEGRA) 180 MG tablet Take by mouth.    . mirabegron ER (MYRBETRIQ) 25 MG TB24 tablet Take by mouth.    . naproxen (NAPROSYN) 500 MG tablet Take by mouth.    . Omega-3 Fatty Acids (FISH OIL) 500 MG CAPS Take by mouth.    . solifenacin (VESICARE) 5 MG tablet Take by mouth.    . tamoxifen (NOLVADEX) 20 MG tablet  TAKE 1 TABLET BY MOUTH EVERY DAY    . trimethoprim (TRIMPEX) 100 MG tablet Take by mouth.    . vitamin E 400 UNIT capsule Take by mouth.     No current facility-administered medications for this visit.   20 mg Tamoxifen She is on Fosamax for osteoporosis.  She is taking calcium and vitamin D.  Review of Systems  Constitutional: Negative.   HENT: Negative.   Eyes: Negative.   Respiratory: Negative.   Cardiovascular: Negative.   Gastrointestinal: Negative.   Genitourinary: Negative.   Musculoskeletal: Negative.   Skin: Negative.   Neurological: Negative.   Endo/Heme/Allergies: Negative.   Psychiatric/Behavioral: Negative.   All other systems reviewed and are negative. 14 point ROS was done and is otherwise as detailed above or in HPI   PHYSICAL EXAMINATION: ECOG PERFORMANCE STATUS: 0 - Asymptomatic  Vitals:   08/11/16 1437  Pulse: (!) 106  Resp: 16  Temp: 98.1 F (36.7 C)   Filed Weights   08/11/16 1437  Weight: 147 lb 9.6 oz (67 kg)    Physical Exam  Constitutional: She is oriented to person, place, and time and well-developed, well-nourished, and in no distress.  HENT:  Head: Normocephalic and atraumatic.  Nose: Nose normal.  Mouth/Throat: Oropharynx is clear and moist. No oropharyngeal exudate.  Eyes: Conjunctivae and EOM are normal. Pupils are equal, round, and reactive to light. Right eye exhibits no discharge. Left eye exhibits no discharge. No scleral icterus.  Neck: Normal range of motion. Neck supple. No tracheal deviation present. No thyromegaly present.  Cardiovascular: Normal rate, regular rhythm and normal heart sounds.  Exam reveals no gallop and no friction rub.   No murmur heard. Pulmonary/Chest: Effort normal and breath sounds normal. She has no wheezes. She has no rales.  Abdominal: Soft. Bowel sounds are normal. She exhibits no distension and no mass. There is no tenderness. There is no rebound and no guarding.  Musculoskeletal: Normal range of  motion. She exhibits no edema.  Lymphadenopathy:    She has no cervical adenopathy.  Neurological: She is alert and oriented to person, place, and time. She has normal reflexes. No cranial nerve deficit. Gait normal. Coordination normal.  Skin: Skin is warm and dry. No rash noted.  Multiple age related skin changes  Psychiatric: Mood, memory, affect and judgment normal.  Nursing note and vitals reviewed. Bilateral Breast exam performed and unremarkable, no palpable masses, no nipple retraction, no suspicious skin changes. No axillary adenopathy. Post surgical incision noted in the L breast without suspicious changes.  LABORATORY DATA:  I  have reviewed the data as listed No results found for: WBC, HGB, HCT, MCV, PLT CMP  No results found for: NA, K, CL, CO2, GLUCOSE, BUN, CREATININE, CALCIUM, PROT, ALBUMIN, AST, ALT, ALKPHOS, BILITOT, GFRNONAA, GFRAA  RADIOGRAPHIC STUDIES: I have personally reviewed the radiological images as listed and agreed with the findings in the report. No results found.  ASSESSMENT & PLAN:  Invasive ductal carcinoma L breast ER+ HER 2 - Tamoxifen 20 mg daily Osteoporosis  She had a breast exam today. NED. She is due for repeat mammography in January. We have ordered this for her today. She will continue on her Tamoxifen as prescribed. She is to continue with yearly gynecologic exams. She has not had a hysterectomy.   She will continue on calcium and vitamin D and fosamax. She is up to date on DEXA.  Follow up with patient in 6 months.    All questions were answered. The patient knows to call the clinic with any problems, questions or concerns.  Orders Placed This Encounter  Procedures  . MM DIAG BREAST TOMO BILATERAL    Standing Status:   Future    Standing Expiration Date:   10/11/2017    Order Specific Question:   Reason for Exam (SYMPTOM  OR DIAGNOSIS REQUIRED)    Answer:   breast cancer, screening    Order Specific Question:   Preferred imaging  location?    Answer:   [facility]    This document serves as a record of services personally performed by Ancil Linsey, MD. It was created on her behalf by Elmyra Ricks, a trained medical scribe. The creation of this record is based on the scribe's personal observations and the provider's statements to them. This document has been checked and approved by the attending provider.  I have reviewed the above documentation for accuracy and completeness, and I agree with the above.  This note was electronically signed.    Molli Hazard, MD  08/11/2016 3:23 PM

## 2016-08-11 NOTE — Patient Instructions (Addendum)
[facility] at [facility] Discharge Instructions  RECOMMENDATIONS MADE BY THE CONSULTANT AND ANY TEST RESULTS WILL BE SENT TO YOUR REFERRING PHYSICIAN.  You saw Dr. Whitney Muse today. Follow up in 6 months. 3D mammogram in January. Must be 3D. Can have at [facility] if they get 3D technology, otherwise have it performed at [facility].  Thank you for choosing [facility] at [facility] to provide your oncology and hematology care.  To afford each patient quality time with our provider, please arrive at least 15 minutes before your scheduled appointment time.   Beginning January 23rd 2017 lab work for the [facility] will be done in the  Main lab at [facility] on 1st floor. If you have a lab appointment with the [facility] please come in thru the  Main Entrance and check in at the main information desk  You need to re-schedule your appointment should you arrive 10 or more minutes late.  We strive to give you quality time with our providers, and arriving late affects you and other patients whose appointments are after yours.  Also, if you no show three or more times for appointments you may be dismissed from the clinic at the providers discretion.     Again, thank you for choosing [facility].  Our hope is that these requests will decrease the amount of time that you wait before being seen by our physicians.       _____________________________________________________________  Should you have questions after your visit to [facility], please contact our office at [phone] between the hours of 8:30 a.m. and 4:30 p.m.  Voicemails left after 4:30 p.m. will not be returned until the following business day.  For prescription refill requests, have your pharmacy contact our office.         Resources For Cancer Patients and their Caregivers ? American Cancer Society: Can assist with transportation, wigs, general needs, runs  Look Good Feel Better.        984-250-1874 ? Cancer Care: Provides financial assistance, online support groups, medication/co-pay assistance.  1-800-813-HOPE (902)694-2510) ? [facility] Assists [place] cancer patients and their families through emotional , educational and financial support.  229-729-4779 ? [place] DSS Where to apply for food stamps, Medicaid and utility assistance. (816)086-4140 ? [facility]: Transportation to medical appointments. 279-122-0552 ? Social Security Administration: May apply for disability if have a Stage IV cancer. 502 119 1758 (506)295-7955 ? [employer], Disability and Transit Services: Assists with nutrition, care and transit needs. [place] Support Programs: @10RELATIVEDAYS @ > Cancer Support Group  2nd Tuesday of the month 1pm-2pm, Journey Room  > Creative Journey  3rd Tuesday of the month 1130am-1pm, Journey Room  > Look Good Feel Better  1st Wednesday of the month 10am-12 noon, Journey Room (Call [place] to register [phone])

## 2016-08-21 NOTE — Patient Instructions (Signed)
Your procedure is scheduled on:  08/27/2016  Report to [facility] at  740   AM.  Call this number if you have problems the morning of surgery: [phone]   Do not eat food or drink liquids :After Midnight.      Take these medicines the morning of surgery with A SIP OF WATER: tranxene, allegra, mirbetriq, vesicare, tamoxifen, trimpex.   Do not wear jewelry, make-up or nail polish.  Do not wear lotions, powders, or perfumes. You may wear deodorant.  Do not shave 48 hours prior to surgery.  Do not bring valuables to the hospital.  Contacts, dentures or bridgework may not be worn into surgery.  Leave suitcase in the car. After surgery it may be brought to your room.  For patients admitted to the hospital, checkout time is 11:00 AM the day of discharge.   Patients discharged the day of surgery will not be allowed to drive home.  :     Please read over the following fact sheets that you were given: Coughing and Deep Breathing, Surgical Site Infection Prevention, Anesthesia Post-op Instructions and Care and Recovery After Surgery    Cataract A cataract is a clouding of the lens of the eye. When a lens becomes cloudy, vision is reduced based on the degree and nature of the clouding. Many cataracts reduce vision to some degree. Some cataracts make people more near-sighted as they develop. Other cataracts increase glare. Cataracts that are ignored and become worse can sometimes look white. The white color can be seen through the pupil. CAUSES   Aging. However, cataracts may occur at any age, even in newborns.   Certain drugs.   Trauma to the eye.   Certain diseases such as diabetes.   Specific eye diseases such as chronic inflammation inside the eye or a sudden attack of a rare form of glaucoma.   Inherited or acquired medical problems.  SYMPTOMS   Gradual, progressive drop in vision in the affected eye.   Severe, rapid visual loss. This most often happens when trauma is the cause.    DIAGNOSIS  To detect a cataract, an eye doctor examines the lens. Cataracts are best diagnosed with an exam of the eyes with the pupils enlarged (dilated) by drops.  TREATMENT  For an early cataract, vision may improve by using different eyeglasses or stronger lighting. If that does not help your vision, surgery is the only effective treatment. A cataract needs to be surgically removed when vision loss interferes with your everyday activities, such as driving, reading, or watching TV. A cataract may also have to be removed if it prevents examination or treatment of another eye problem. Surgery removes the cloudy lens and usually replaces it with a substitute lens (intraocular lens, IOL).  At a time when both you and your doctor agree, the cataract will be surgically removed. If you have cataracts in both eyes, only one is usually removed at a time. This allows the operated eye to heal and be out of danger from any possible problems after surgery (such as infection or poor wound healing). In rare cases, a cataract may be doing damage to your eye. In these cases, your caregiver may advise surgical removal right away. The vast majority of people who have cataract surgery have better vision afterward. HOME CARE INSTRUCTIONS  If you are not planning surgery, you may be asked to do the following:  Use different eyeglasses.   Use stronger or brighter lighting.   Ask your  eye doctor about reducing your medicine dose or changing medicines if it is thought that a medicine caused your cataract. Changing medicines does not make the cataract go away on its own.   Become familiar with your surroundings. Poor vision can lead to injury. Avoid bumping into things on the affected side. You are at a higher risk for tripping or falling.   Exercise extreme care when driving or operating machinery.   Wear sunglasses if you are sensitive to bright light or experiencing problems with glare.  SEEK IMMEDIATE MEDICAL CARE  IF:   You have a worsening or sudden vision loss.   You notice redness, swelling, or increasing pain in the eye.   You have a fever.  Document Released: 11/16/2005 Document Revised: 11/05/2011 Document Reviewed: 07/10/2011 [facility] Patient Information 2012 [employer].PATIENT INSTRUCTIONS POST-ANESTHESIA  IMMEDIATELY FOLLOWING SURGERY:  Do not drive or operate machinery for the first twenty four hours after surgery.  Do not make any important decisions for twenty four hours after surgery or while taking narcotic pain medications or sedatives.  If you develop intractable nausea and vomiting or a severe headache please notify your doctor immediately.  FOLLOW-UP:  Please make an appointment with your surgeon as instructed. You do not need to follow up with anesthesia unless specifically instructed to do so.  WOUND CARE INSTRUCTIONS (if applicable):  Keep a dry clean dressing on the anesthesia/puncture wound site if there is drainage.  Once the wound has quit draining you may leave it open to air.  Generally you should leave the bandage intact for twenty four hours unless there is drainage.  If the epidural site drains for more than 36-48 hours please call the anesthesia department.  QUESTIONS?:  Please feel free to call your physician or the hospital operator if you have any questions, and they will be happy to assist you.

## 2016-08-24 ENCOUNTER — Encounter (HOSPITAL_COMMUNITY): Payer: Self-pay | Admitting: *Deleted

## 2016-08-24 ENCOUNTER — Encounter (HOSPITAL_COMMUNITY)
Admission: RE | Admit: 2016-08-24 | Discharge: 2016-08-24 | Disposition: A | Discharge status: Home / Self Care | Payer: Medicare Other | Source: Ambulatory Visit | Attending: Ophthalmology | Admitting: Ophthalmology

## 2016-08-24 DIAGNOSIS — Z9010 Acquired absence of unspecified breast and nipple: Secondary | ICD-10-CM | POA: Diagnosis not present

## 2016-08-24 DIAGNOSIS — C50919 Malignant neoplasm of unspecified site of unspecified female breast: Secondary | ICD-10-CM | POA: Diagnosis not present

## 2016-08-24 DIAGNOSIS — Z79899 Other long term (current) drug therapy: Secondary | ICD-10-CM | POA: Diagnosis not present

## 2016-08-24 DIAGNOSIS — H2511 Age-related nuclear cataract, right eye: Secondary | ICD-10-CM | POA: Diagnosis not present

## 2016-08-24 DIAGNOSIS — H43393 Other vitreous opacities, bilateral: Secondary | ICD-10-CM | POA: Diagnosis not present

## 2016-08-24 DIAGNOSIS — E7800 Pure hypercholesterolemia, unspecified: Secondary | ICD-10-CM | POA: Diagnosis not present

## 2016-08-24 DIAGNOSIS — M810 Age-related osteoporosis without current pathological fracture: Secondary | ICD-10-CM | POA: Diagnosis not present

## 2016-08-24 LAB — BASIC METABOLIC PANEL
ANION GAP: 8 (ref 5–15)
BUN: 22 mg/dL — ABNORMAL HIGH (ref 6–20)
CHLORIDE: 106 mmol/L (ref 101–111)
CO2: 24 mmol/L (ref 22–32)
CREATININE: 0.9 mg/dL (ref 0.44–1.00)
Calcium: 9 mg/dL (ref 8.9–10.3)
GFR calc non Af Amer: >60 mL/min (ref >60)
Glucose, Bld: 108 mg/dL — ABNORMAL HIGH (ref 65–99)
Potassium: 3.7 mmol/L (ref 3.5–5.1)
SODIUM: 138 mmol/L (ref 135–145)

## 2016-08-24 LAB — CBC WITH DIFFERENTIAL/PLATELET
BASOS ABS: 0 10*3/uL (ref 0.0–0.1)
BASOS PCT: 0 %
EOS ABS: 0 10*3/uL (ref 0.0–0.7)
Eosinophils Relative: 0 %
HCT: 38 % (ref 36.0–46.0)
HEMOGLOBIN: 12.6 g/dL (ref 12.0–15.0)
Lymphocytes Relative: 27 %
Lymphs Abs: 2.2 10*3/uL (ref 0.7–4.0)
MCH: 31.7 pg (ref 26.0–34.0)
MCHC: 33.2 g/dL (ref 30.0–36.0)
MCV: 95.5 fL (ref 78.0–100.0)
MONOS PCT: 6 %
Monocytes Absolute: 0.5 10*3/uL (ref 0.1–1.0)
NEUTROS PCT: 67 %
Neutro Abs: 5.5 10*3/uL (ref 1.7–7.7)
Platelets: 257 10*3/uL (ref 150–400)
RBC: 3.98 MIL/uL (ref 3.87–5.11)
RDW: 12.4 % (ref 11.5–15.5)
WBC: 8.2 10*3/uL (ref 4.0–10.5)

## 2016-08-27 ENCOUNTER — Ambulatory Visit (HOSPITAL_COMMUNITY): Payer: Medicare Other | Admitting: Anesthesiology

## 2016-08-27 ENCOUNTER — Encounter (HOSPITAL_COMMUNITY)
Admission: RE | Disposition: A | Discharge status: Home / Self Care | Payer: Self-pay | Source: Ambulatory Visit | Attending: Ophthalmology

## 2016-08-27 ENCOUNTER — Encounter (HOSPITAL_COMMUNITY): Payer: Self-pay | Admitting: Ophthalmology

## 2016-08-27 ENCOUNTER — Ambulatory Visit (HOSPITAL_COMMUNITY)
Admission: RE | Admit: 2016-08-27 | Discharge: 2016-08-27 | Disposition: A | Discharge status: Home / Self Care | Payer: Medicare Other | Source: Ambulatory Visit | Attending: Ophthalmology | Admitting: Ophthalmology

## 2016-08-27 DIAGNOSIS — Z79899 Other long term (current) drug therapy: Secondary | ICD-10-CM | POA: Diagnosis not present

## 2016-08-27 DIAGNOSIS — H43393 Other vitreous opacities, bilateral: Secondary | ICD-10-CM | POA: Insufficient documentation

## 2016-08-27 DIAGNOSIS — M810 Age-related osteoporosis without current pathological fracture: Secondary | ICD-10-CM | POA: Insufficient documentation

## 2016-08-27 DIAGNOSIS — Z9010 Acquired absence of unspecified breast and nipple: Secondary | ICD-10-CM | POA: Diagnosis not present

## 2016-08-27 DIAGNOSIS — C50919 Malignant neoplasm of unspecified site of unspecified female breast: Secondary | ICD-10-CM | POA: Diagnosis not present

## 2016-08-27 DIAGNOSIS — H2511 Age-related nuclear cataract, right eye: Secondary | ICD-10-CM | POA: Diagnosis not present

## 2016-08-27 DIAGNOSIS — E7800 Pure hypercholesterolemia, unspecified: Secondary | ICD-10-CM | POA: Insufficient documentation

## 2016-08-27 HISTORY — PX: CATARACT EXTRACTION W/PHACO: SHX586

## 2016-08-27 SURGERY — PHACOEMULSIFICATION, CATARACT, WITH IOL INSERTION
Anesthesia: Monitor Anesthesia Care | Site: Eye | Laterality: Right

## 2016-08-27 MED ORDER — LIDOCAINE HCL 3.5 % OP GEL
1.0000 "application " | Freq: Once | OPHTHALMIC | Status: AC
  Administered 2016-08-27: 1 via OPHTHALMIC

## 2016-08-27 MED ORDER — MIDAZOLAM HCL 2 MG/2ML IJ SOLN
INTRAMUSCULAR | Status: AC
  Filled 2016-08-27: qty 2

## 2016-08-27 MED ORDER — LACTATED RINGERS IV SOLN
INTRAVENOUS | Status: DC
  Administered 2016-08-27: 1000 mL via INTRAVENOUS

## 2016-08-27 MED ORDER — FENTANYL CITRATE (PF) 100 MCG/2ML IJ SOLN
25.0000 ug | INTRAMUSCULAR | Status: AC | PRN
  Administered 2016-08-27 (×2): 25 ug via INTRAVENOUS

## 2016-08-27 MED ORDER — CYCLOPENTOLATE-PHENYLEPHRINE 0.2-1 % OP SOLN
1.0000 [drp] | OPHTHALMIC | Status: AC
  Administered 2016-08-27 (×3): 1 [drp] via OPHTHALMIC

## 2016-08-27 MED ORDER — MIDAZOLAM HCL 2 MG/2ML IJ SOLN
1.0000 mg | INTRAMUSCULAR | Status: DC | PRN
  Administered 2016-08-27: 2 mg via INTRAVENOUS

## 2016-08-27 MED ORDER — NEOMYCIN-POLYMYXIN-DEXAMETH 3.5-10000-0.1 OP SUSP
OPHTHALMIC | Status: DC | PRN
  Administered 2016-08-27: 2 [drp] via OPHTHALMIC

## 2016-08-27 MED ORDER — PHENYLEPHRINE HCL 2.5 % OP SOLN
1.0000 [drp] | OPHTHALMIC | Status: AC
  Administered 2016-08-27 (×3): 1 [drp] via OPHTHALMIC

## 2016-08-27 MED ORDER — EPINEPHRINE HCL 1 MG/ML IJ SOLN
INTRAOCULAR | Status: DC | PRN
  Administered 2016-08-27: 500 mL

## 2016-08-27 MED ORDER — BSS IO SOLN
INTRAOCULAR | Status: DC | PRN
  Administered 2016-08-27: 15 mL

## 2016-08-27 MED ORDER — POVIDONE-IODINE 5 % OP SOLN
OPHTHALMIC | Status: DC | PRN
  Administered 2016-08-27: 1 via OPHTHALMIC

## 2016-08-27 MED ORDER — LIDOCAINE HCL (PF) 1 % IJ SOLN
INTRAMUSCULAR | Status: DC | PRN
  Administered 2016-08-27: .5 mL

## 2016-08-27 MED ORDER — FENTANYL CITRATE (PF) 100 MCG/2ML IJ SOLN
INTRAMUSCULAR | Status: AC
  Filled 2016-08-27: qty 2

## 2016-08-27 MED ORDER — TETRACAINE HCL 0.5 % OP SOLN
1.0000 [drp] | OPHTHALMIC | Status: AC
Start: 2016-08-27 — End: 2016-08-27
  Administered 2016-08-27 (×3): 1 [drp] via OPHTHALMIC

## 2016-08-27 MED ORDER — PROVISC 10 MG/ML IO SOLN
INTRAOCULAR | Status: DC | PRN
  Administered 2016-08-27: 0.85 mL via INTRAOCULAR

## 2016-08-27 SURGICAL SUPPLY — 12 items
CLOTH BEACON ORANGE TIMEOUT ST (SAFETY) ×2 IMPLANT
EYE SHIELD UNIVERSAL CLEAR (GAUZE/BANDAGES/DRESSINGS) ×2 IMPLANT
GLOVE BIOGEL PI IND STRL 6.5 (GLOVE) ×1 IMPLANT
GLOVE BIOGEL PI IND STRL 7.0 (GLOVE) ×1 IMPLANT
GLOVE BIOGEL PI INDICATOR 6.5 (GLOVE) ×1
GLOVE BIOGEL PI INDICATOR 7.0 (GLOVE) ×1
PAD ARMBOARD 7.5X6 YLW CONV (MISCELLANEOUS) ×2 IMPLANT
SIGHTPATH CAT PROC W REG LENS (Ophthalmic Related) ×2 IMPLANT
SYRINGE LUER LOK 1CC (MISCELLANEOUS) ×2 IMPLANT
TAPE SURG TRANSPORE 1 IN (GAUZE/BANDAGES/DRESSINGS) ×1 IMPLANT
TAPE SURGICAL TRANSPORE 1 IN (GAUZE/BANDAGES/DRESSINGS) ×1
WATER STERILE IRR 250ML POUR (IV SOLUTION) ×2 IMPLANT

## 2016-08-27 NOTE — Transfer of Care (Signed)
Immediate Anesthesia Transfer of Care Note  Patient: Pamela Li  Procedure(s) Performed: Procedure(s): CATARACT EXTRACTION PHACO AND INTRAOCULAR LENS PLACEMENT RIGHT EYE CDE=3.78 (Right)  Patient Location: Short Stay  Anesthesia Type:MAC  Level of Consciousness: awake  Airway & Oxygen Therapy: Patient Spontanous Breathing  Post-op Assessment: Report given to RN  Post vital signs: Reviewed  Last Vitals:  Vitals:   08/27/16 0855 08/27/16 0900  BP: (!) 154/69 112/64  Resp: (!) 29 12  Temp:      Last Pain:  Vitals:   08/27/16 0820  PainSc: 0-No pain         Complications: No apparent anesthesia complications

## 2016-08-27 NOTE — Anesthesia Postprocedure Evaluation (Signed)
Anesthesia Post Note  Patient: Pamela Li  Procedure(s) Performed: Procedure(s) (LRB): CATARACT EXTRACTION PHACO AND INTRAOCULAR LENS PLACEMENT RIGHT EYE CDE=3.78 (Right)  Patient location during evaluation: Short Stay Anesthesia Type: MAC Level of consciousness: awake and alert and awake Pain management: pain level controlled Respiratory status: spontaneous breathing Cardiovascular status: blood pressure returned to baseline Postop Assessment: no signs of nausea or vomiting Anesthetic complications: no    Last Vitals:  Vitals:   08/27/16 0855 08/27/16 0900  BP: (!) 154/69 112/64  Resp: (!) 29 12  Temp:      Last Pain:  Vitals:   08/27/16 0820  PainSc: 0-No pain                 Girtie Wiersma

## 2016-08-27 NOTE — Op Note (Signed)
Date of Admission: 08/27/2016  Date of Surgery: 08/27/2016   Pre-Op Dx: Cataract Right Eye  Post-Op Dx: Senile Nuclear Cataract Right  Eye,  Dx Code H25.12  Surgeon: Tonny Branch, M.D.  Assistants: None  Anesthesia: Topical with MAC  Indications: Painless, progressive loss of vision with compromise of daily activities.  Surgery: Cataract Extraction with Intraocular lens Implant Right Eye  Discription: The patient had dilating drops and viscous lidocaine placed into the Right eye in the pre-op holding area. After transfer to the operating room, a time out was performed. The patient was then prepped and draped. Beginning with a 23 degree blade a paracentesis port was made at the surgeon's 2 o'clock position. The anterior chamber was then filled with 1% non-preserved lidocaine. This was followed by filling the anterior chamber with Provisc.  A 2.53mm keratome blade was used to make a clear corneal incision at the temporal limbus.  A bent cystatome needle was used to create a continuous tear capsulotomy. Hydrodissection was performed with balanced salt solution on a Fine canula. The lens nucleus was then removed using the phacoemulsification handpiece. Residual cortex was removed with the I&A handpiece. The anterior chamber and capsular bag were refilled with Provisc. A posterior chamber intraocular lens was placed into the capsular bag with it's injector. The implant was positioned with the Kuglan hook. The Provisc was then removed from the anterior chamber and capsular bag with the I&A handpiece. Stromal hydration of the main incision and paracentesis port was performed with BSS on a Fine canula. The wounds were tested for leak which was negative. The patient tolerated the procedure well. There were no operative complications. The patient was then transferred to the recovery room in stable condition.  Complications: None  Specimen: None  EBL: None  Prosthetic device: Hoya iSert 250, power 21.0 D,  SN [id number].

## 2016-08-27 NOTE — H&P (Signed)
I have reviewed the H&P, the patient was re-examined, and I have identified no interval changes in medical condition and plan of care since the history and physical of record  

## 2016-08-27 NOTE — Anesthesia Preprocedure Evaluation (Signed)
Anesthesia Evaluation  Patient identified by MRN, date of birth, ID band Patient awake    Reviewed: Allergy & Precautions, NPO status , Patient's Chart, lab work & pertinent test results  Airway Mallampati: III  TM Distance: <3 FB Neck ROM: Full  Mouth opening: Limited Mouth Opening  Dental  (+) Teeth Intact, Partial Upper   Pulmonary neg pulmonary ROS,    breath sounds clear to auscultation       Cardiovascular negative cardio ROS   Rhythm:Regular Rate:Normal     Neuro/Psych negative neurological ROS  negative psych ROS   GI/Hepatic negative GI ROS,   Endo/Other    Renal/GU      Musculoskeletal   Abdominal   Peds  Hematology   Anesthesia Other Findings   Reproductive/Obstetrics                             Anesthesia Physical Anesthesia Plan  ASA: II  Anesthesia Plan: MAC   Post-op Pain Management:    Induction: Intravenous  Airway Management Planned: Nasal Cannula  Additional Equipment:   Intra-op Plan:   Post-operative Plan:   Informed Consent: I have reviewed the patients History and Physical, chart, labs and discussed the procedure including the risks, benefits and alternatives for the proposed anesthesia with the patient or authorized representative who has indicated his/her understanding and acceptance.     Plan Discussed with:   Anesthesia Plan Comments:         Anesthesia Quick Evaluation

## 2016-08-27 NOTE — Discharge Instructions (Signed)

## 2016-08-31 ENCOUNTER — Encounter (HOSPITAL_COMMUNITY): Payer: Self-pay | Admitting: Ophthalmology

## 2016-09-01 DIAGNOSIS — M79671 Pain in right foot: Secondary | ICD-10-CM | POA: Diagnosis not present

## 2016-09-01 DIAGNOSIS — M25579 Pain in unspecified ankle and joints of unspecified foot: Secondary | ICD-10-CM | POA: Diagnosis not present

## 2016-09-01 DIAGNOSIS — M79672 Pain in left foot: Secondary | ICD-10-CM | POA: Diagnosis not present

## 2016-09-08 DIAGNOSIS — E7800 Pure hypercholesterolemia, unspecified: Secondary | ICD-10-CM | POA: Diagnosis not present

## 2016-09-08 DIAGNOSIS — M1990 Unspecified osteoarthritis, unspecified site: Secondary | ICD-10-CM | POA: Diagnosis not present

## 2016-09-08 DIAGNOSIS — I10 Essential (primary) hypertension: Secondary | ICD-10-CM | POA: Diagnosis not present

## 2016-09-15 DIAGNOSIS — Z0001 Encounter for general adult medical examination with abnormal findings: Secondary | ICD-10-CM | POA: Diagnosis not present

## 2016-09-15 DIAGNOSIS — I10 Essential (primary) hypertension: Secondary | ICD-10-CM | POA: Diagnosis not present

## 2016-12-05 DIAGNOSIS — R05 Cough: Secondary | ICD-10-CM | POA: Diagnosis not present

## 2016-12-05 DIAGNOSIS — J069 Acute upper respiratory infection, unspecified: Secondary | ICD-10-CM | POA: Diagnosis not present

## 2016-12-05 DIAGNOSIS — J0100 Acute maxillary sinusitis, unspecified: Secondary | ICD-10-CM | POA: Diagnosis not present

## 2016-12-22 ENCOUNTER — Encounter (HOSPITAL_COMMUNITY): Payer: Medicare Other

## 2017-01-06 DIAGNOSIS — Z9889 Other specified postprocedural states: Secondary | ICD-10-CM | POA: Diagnosis not present

## 2017-01-06 DIAGNOSIS — Z853 Personal history of malignant neoplasm of breast: Secondary | ICD-10-CM | POA: Diagnosis not present

## 2017-01-06 DIAGNOSIS — R928 Other abnormal and inconclusive findings on diagnostic imaging of breast: Secondary | ICD-10-CM | POA: Diagnosis not present

## 2017-02-08 ENCOUNTER — Ambulatory Visit (HOSPITAL_COMMUNITY): Payer: Medicare Other

## 2017-02-08 ENCOUNTER — Ambulatory Visit (HOSPITAL_COMMUNITY): Payer: Medicare Other | Admitting: Adult Health

## 2017-03-15 ENCOUNTER — Encounter (HOSPITAL_COMMUNITY): Payer: Medicare Other

## 2017-03-15 ENCOUNTER — Encounter (HOSPITAL_COMMUNITY): Payer: Self-pay | Admitting: Oncology

## 2017-03-15 ENCOUNTER — Ambulatory Visit (HOSPITAL_COMMUNITY): Payer: Medicare Other | Admitting: Oncology

## 2017-03-15 ENCOUNTER — Encounter (HOSPITAL_COMMUNITY): Payer: Medicare Other | Attending: Hematology & Oncology | Admitting: Oncology

## 2017-03-15 VITALS — BP 157/83 | HR 85 | Temp 98.0°F | Resp 18 | Wt 141.9 lb

## 2017-03-15 DIAGNOSIS — Z170 Estrogen receptor positive status [ER+]: Secondary | ICD-10-CM | POA: Diagnosis not present

## 2017-03-15 DIAGNOSIS — M810 Age-related osteoporosis without current pathological fracture: Secondary | ICD-10-CM | POA: Insufficient documentation

## 2017-03-15 DIAGNOSIS — R309 Painful micturition, unspecified: Secondary | ICD-10-CM | POA: Insufficient documentation

## 2017-03-15 DIAGNOSIS — Z79810 Long term (current) use of selective estrogen receptor modulators (SERMs): Secondary | ICD-10-CM | POA: Diagnosis not present

## 2017-03-15 DIAGNOSIS — Z87440 Personal history of urinary (tract) infections: Secondary | ICD-10-CM | POA: Diagnosis not present

## 2017-03-15 DIAGNOSIS — C50912 Malignant neoplasm of unspecified site of left female breast: Secondary | ICD-10-CM | POA: Diagnosis not present

## 2017-03-15 LAB — URINALYSIS, ROUTINE W REFLEX MICROSCOPIC
BILIRUBIN URINE: NEGATIVE
Glucose, UA: NEGATIVE mg/dL
HGB URINE DIPSTICK: NEGATIVE
Ketones, ur: NEGATIVE mg/dL
Leukocytes, UA: NEGATIVE
Nitrite: NEGATIVE
PH: 7 (ref 5.0–8.0)
Protein, ur: NEGATIVE mg/dL
SPECIFIC GRAVITY, URINE: 1.014 (ref 1.005–1.030)

## 2017-03-15 LAB — COMPREHENSIVE METABOLIC PANEL
ALT: 19 U/L (ref 14–54)
ANION GAP: 6 (ref 5–15)
AST: 22 U/L (ref 15–41)
Albumin: 3.9 g/dL (ref 3.5–5.0)
Alkaline Phosphatase: 40 U/L (ref 38–126)
BILIRUBIN TOTAL: 0.2 mg/dL — AB (ref 0.3–1.2)
BUN: 20 mg/dL (ref 6–20)
CALCIUM: 9 mg/dL (ref 8.9–10.3)
CO2: 30 mmol/L (ref 22–32)
CREATININE: 0.62 mg/dL (ref 0.44–1.00)
Chloride: 101 mmol/L (ref 101–111)
Glucose, Bld: 88 mg/dL (ref 65–99)
Potassium: 3.7 mmol/L (ref 3.5–5.1)
SODIUM: 137 mmol/L (ref 135–145)
TOTAL PROTEIN: 7 g/dL (ref 6.5–8.1)

## 2017-03-15 LAB — CBC WITH DIFFERENTIAL/PLATELET
Basophils Absolute: 0 10*3/uL (ref 0.0–0.1)
Basophils Relative: 0 %
EOS ABS: 0 10*3/uL (ref 0.0–0.7)
Eosinophils Relative: 1 %
HCT: 37.5 % (ref 36.0–46.0)
HEMOGLOBIN: 12.4 g/dL (ref 12.0–15.0)
LYMPHS ABS: 2.3 10*3/uL (ref 0.7–4.0)
LYMPHS PCT: 29 %
MCH: 32.3 pg (ref 26.0–34.0)
MCHC: 33.1 g/dL (ref 30.0–36.0)
MCV: 97.7 fL (ref 78.0–100.0)
MONOS PCT: 7 %
Monocytes Absolute: 0.5 10*3/uL (ref 0.1–1.0)
Neutro Abs: 5.2 10*3/uL (ref 1.7–7.7)
Neutrophils Relative %: 63 %
Platelets: 255 10*3/uL (ref 150–400)
RBC: 3.84 MIL/uL — AB (ref 3.87–5.11)
RDW: 13.1 % (ref 11.5–15.5)
WBC: 8.1 10*3/uL (ref 4.0–10.5)

## 2017-03-15 MED ORDER — TAMOXIFEN CITRATE 20 MG PO TABS: 20.0000 mg | ORAL_TABLET | Freq: Every day | ORAL | 1 refills | Status: DC

## 2017-03-15 NOTE — Assessment & Plan Note (Signed)
Urinary pain with a history of UTI.  Order is placed for UA with reflex and culture.

## 2017-03-15 NOTE — Patient Instructions (Signed)
[place] at [facility] Discharge Instructions  RECOMMENDATIONS MADE BY THE CONSULTANT AND ANY TEST RESULTS WILL BE SENT TO YOUR REFERRING PHYSICIAN.  You were seen today by Manning Charity We sent a prescription to the pharmacy for Tamoxifen You will have lab work and urinalysis today, we will call you with the results Reclast injection next week Follow up in 6 months with breast exam at that time See Amy up front for appointments   Thank you for choosing [place] at [facility] to provide your oncology and hematology care.  To afford each patient quality time with our provider, please arrive at least 15 minutes before your scheduled appointment time.    If you have a lab appointment with the [place] please come in thru the  Main Entrance and check in at the main information desk  You need to re-schedule your appointment should you arrive 10 or more minutes late.  We strive to give you quality time with our providers, and arriving late affects you and other patients whose appointments are after yours.  Also, if you no show three or more times for appointments you may be dismissed from the clinic at the providers discretion.     Again, thank you for choosing [facility].  Our hope is that these requests will decrease the amount of time that you wait before being seen by our physicians.       _____________________________________________________________  Should you have questions after your visit to [facility], please contact our office at [phone] between the hours of 8:30 a.m. and 4:30 p.m.  Voicemails left after 4:30 p.m. will not be returned until the following business day.  For prescription refill requests, have your pharmacy contact our office.       Resources For Cancer Patients and their Caregivers ? American Cancer Society: Can assist with transportation, wigs, general needs, runs Look  Good Feel Better.        5036018057 ? Cancer Care: Provides financial assistance, online support groups, medication/co-pay assistance.  1-800-813-HOPE 775 109 0961) ? [place] Assists [place] cancer patients and their families through emotional , educational and financial support.  419-553-8657 ? [place] DSS Where to apply for food stamps, Medicaid and utility assistance. (760)771-4423 ? RCATS: Transportation to medical appointments. (719)767-9570 ? Social Security Administration: May apply for disability if have a Stage IV cancer. [phone] ? [employer], Disability and Transit Services: Assists with nutrition, care and transit needs. [place] Support Programs: @10RELATIVEDAYS @ > Cancer Support Group  2nd Tuesday of the month 1pm-2pm, Journey Room  > Creative Journey  3rd Tuesday of the month 1130am-1pm, Journey Room  > Look Good Feel Better  1st Wednesday of the month 10am-12 noon, Journey Room (Call [place] to register [phone])

## 2017-03-15 NOTE — Assessment & Plan Note (Signed)
Osteoporosis, age related without fracture.  Intolerant to fosamax, Prolia was cost-prohibitive.   Her primary care provider is inquiring about Reclast which would certainly be an option.    I reviewed the risks, benefits, alternatives, and side effects of Reclast therapy including, but not limited to, hypocalcemia and osteonecrosis of the jaw.  She is advised to continue with calcium and vitamin D.  She is also advised to let her dentist know that she is on bisphosphonate therapy.  She is to avoid any elective oral surgery without updating medical oncology and her dentist about bisphosphonate therapy.  Labs today: CMET.  Will plan on starting Reclast next week.    Supportive therapy plan is built.

## 2017-03-15 NOTE — Assessment & Plan Note (Addendum)
Invasive ductal carcinoma of left breast, Stage IA (T1CN0M0), S/P left lumpectomy and sentinel lymph node biopsy on 12/11/2012, ER/PR+.  No radiation therapy.  On anti-estrogen therapy with Tamoxifen beginning on 01/05/2013 for which she will take for 5 years.  Oncology history updated.   Staging in [facility] problem list completed.  Labs today: CBC diff, CMET.  I personally reviewed and went over laboratory results with the patient.  The results are noted within this dictation.  Labs in 6 months: CBC diff, CMET.  Labs from October by primary care provider are also reviewed.  A copy of these labs reside in today's documentation.  I personally reviewed and went over radiographic studies with the patient.  The results are noted within this dictation.  She is up to date on Mammogram.  Her next mammogram is due in Feb 2019.  I have reviewed her March 2017 bone density that demonstrates osteoporosis.  Problem list reviewed with patient and edited accordingly.  Medications are reviewed with the patient and edited accordingly.  Return in 6 months for follow-up with a breast exam.  More than 50% of the time spent with the patient was utilized for counseling and coordination of care.

## 2017-03-15 NOTE — Progress Notes (Signed)
Pamela Percy, MD [place] [address]  Neoplasm of left breast, regional lymph node staging category pN0 per American Joint Committee on Cancer Staging Guidelines, 7th edition [facility]) - Plan: tamoxifen (NOLVADEX) 20 MG tablet, CBC with Differential, Comprehensive metabolic panel, CBC with Differential, Comprehensive metabolic panel  Age-related osteoporosis without current pathological fracture - Plan: Comprehensive metabolic panel, Comprehensive metabolic panel  Urinary pain - Plan: Urinalysis, Routine w reflex microscopic, Urine culture, Urinalysis, Routine w reflex microscopic, Urine culture  CURRENT THERAPY: Tamoxifen 20 mg daily beginning on 01/05/2013.  Reclast annually beginning in April 2018.  INTERVAL HISTORY: Pamela Li 76 y.o. female returns for followup of invasive ductal carcinoma of left breast, Stage IA (T1CN0M0), S/P left lumpectomy and sentinel lymph node biopsy on 12/11/2012, ER/PR+.  No radiation therapy.  On anti-estrogen therapy with Tamoxifen beginning on 01/05/2013 for which she will take for 5 years. AND Osteoporosis, age-related without any fractures.  Intolerant to fosamax, Prolia was cost-prohibitive.  Planning to begin Reclast annually.    Neoplasm of left breast, regional lymph node staging category pN0 per American Joint Committee on Cancer Staging Guidelines, 7th edition [facility])   12/11/2012 Initial Diagnosis    Left Lumpectomy and sentinel node biopsy      01/05/2013 -  Anti-estrogen oral therapy    Tamoxifen 20 mg daily      02/05/2016 Imaging    DEXA t-score L femur -2.5 unchanged from 2014, OSTEOPOROSIS      01/06/2017 Mammogram    Negative for any signs of breast cancer      She reports having influenza in the fall/winter time 2017.  She was treated with Tamiflu.  She noted significant arthralgias and myalgias with nausea and vomiting.  She recovered within 1 week.  She recently underwent cataract removal.  She notes significant  improvement in the vision of the eye that was affected.  She reports needing cataract removal in the contralateral eye as well.  She is pleased with results thus far.  She is up-to-date on mammogram and bone density exam.  She provides me a piece of paper from her primary care physician inquiring about utilizing Reclast given the patient's intolerance to Fosamax and  the issue with financial toxicity of Prolia in the past.  Given that she has osteoporosis, annual Reclast would certainly be reasonable.  I reviewed the risks, benefits, alternatives, and side effects of Reclast therapy including, but not limited to, hypocalcemia and osteonecrosis of the jaw.  She is advised to continue with calcium and vitamin D.  She is also advised to let her dentist know that she is on bisphosphonate therapy.  She is to avoid any elective oral surgery without updating medical oncology and her dentist about bisphosphonate therapy.  She reports a recent history of urinary tract infection.  She finished an antibiotic for this.  She notes recent onset of urinary burning.  We will work this up.  She notes left hip osteoarthritis that is exacerbated with exercise and yard work.  She otherwise denies any significant arthralgias or myalgias.  She denies any hot flashes.  She denies any vaginal bleeding.  She denies any VTE symptoms  Review of Systems  Constitutional: Negative.  Negative for chills, fever and weight loss.  HENT: Negative.   Eyes: Negative.   Respiratory: Negative.  Negative for cough.   Cardiovascular: Negative.  Negative for chest pain.  Gastrointestinal: Negative.  Negative for blood in stool, constipation, diarrhea,  melena, nausea and vomiting.  Genitourinary: Negative.   Musculoskeletal: Positive for joint pain (left hip).  Skin: Negative.   Neurological: Negative.  Negative for weakness.  Endo/Heme/Allergies: Negative.   Psychiatric/Behavioral: Negative.     Past Medical History:  Diagnosis  Date  . Allergy   . Arthritis   . Breast cancer ([place])   . Neoplasm of left breast, regional lymph node staging category pN0 per American Joint Committee on Cancer Staging Guidelines, 7th edition ([place]) 08/11/2016  . Osteoporosis   . Overactive bladder     Past Surgical History:  Procedure Laterality Date  . ABDOMINAL HYSTERECTOMY     partial  . BREAST LUMPECTOMY    . CATARACT EXTRACTION W/PHACO Right 08/27/2016   Procedure: CATARACT EXTRACTION PHACO AND INTRAOCULAR LENS PLACEMENT RIGHT EYE CDE=3.78;  Surgeon: Tonny Branch, MD;  Location: [facility];  Service: Ophthalmology;  Laterality: Right;    Family History  Problem Relation Age of Onset  . Cancer Mother     melanoma  . Heart disease Father   . Heart disease Brother   . Heart disease Brother     Social History   Social History  . Marital status: Married    Spouse name: N/A  . Number of children: N/A  . Years of education: N/A   Social History Main Topics  . Smoking status: Never Smoker  . Smokeless tobacco: Never Used  . Alcohol use Yes     Comment: once a month glass of wine with supper  . Drug use: No  . Sexual activity: Not Currently   Other Topics Concern  . None   Social History Narrative  . None     PHYSICAL EXAMINATION  ECOG PERFORMANCE STATUS: 1 - Symptomatic but completely ambulatory  Vitals:   03/15/17 1357  BP: (!) 157/83  Pulse: 85  Resp: 18  Temp: 98 F (36.7 C)    GENERAL:alert, no distress, well nourished, well developed, comfortable, cooperative, smiling and unaccompanied SKIN: skin color, texture, turgor are normal, no rashes or significant lesions HEAD: Normocephalic, No masses, lesions, tenderness or abnormalities EYES: normal, EOMI, Conjunctiva are pink and non-injected EARS: External ears normal OROPHARYNX:lips, buccal mucosa, and tongue normal and mucous membranes are moist  NECK: supple, no adenopathy, trachea midline LYMPH:  no palpable lymphadenopathy BREAST:risk and benefit  of breast self-exam was discussed, not examined LUNGS: clear to auscultation and percussion HEART: regular rate & rhythm, no murmurs, no gallops, S1 normal and S2 normal ABDOMEN:abdomen soft, non-tender and normal bowel sounds BACK: Back symmetric, no curvature. EXTREMITIES:less then 2 second capillary refill, no joint deformities, effusion, or inflammation, no skin discoloration, no cyanosis  NEURO: alert & oriented x 3 with fluent speech, no focal motor/sensory deficits, gait normal   LABORATORY DATA: CBC    Component Value Date/Time   WBC 8.2 08/24/2016 1302   RBC 3.98 08/24/2016 1302   HGB 12.6 08/24/2016 1302   HCT 38.0 08/24/2016 1302   PLT 257 08/24/2016 1302   MCV 95.5 08/24/2016 1302   MCH 31.7 08/24/2016 1302   MCHC 33.2 08/24/2016 1302   RDW 12.4 08/24/2016 1302   LYMPHSABS 2.2 08/24/2016 1302   MONOABS 0.5 08/24/2016 1302   EOSABS 0.0 08/24/2016 1302   BASOSABS 0.0 08/24/2016 1302      Chemistry      Component Value Date/Time   NA 138 08/24/2016 1302   K 3.7 08/24/2016 1302   CL 106 08/24/2016 1302   CO2 24 08/24/2016 1302   BUN 22 (  H) 08/24/2016 1302   CREATININE 0.90 08/24/2016 1302      Component Value Date/Time   CALCIUM 9.0 08/24/2016 1302              PENDING LABS:   RADIOGRAPHIC STUDIES:               PATHOLOGY:    ASSESSMENT AND PLAN:  Neoplasm of left breast, regional lymph node staging category pN0 per American Joint Committee on Cancer Staging Guidelines, 7th edition [facility]) Invasive ductal carcinoma of left breast, Stage IA (T1CN0M0), S/P left lumpectomy and sentinel lymph node biopsy on 12/11/2012, ER/PR+.  No radiation therapy.  On anti-estrogen therapy with Tamoxifen beginning on 01/05/2013 for which she will take for 5 years.  Oncology history updated.   Staging in CHL problem list completed.  Labs today: CBC diff, CMET.  I personally reviewed and went over laboratory results with the patient.  The results are  noted within this dictation.  Labs in 6 months: CBC diff, CMET.  Labs from October by primary care provider are also reviewed.  A copy of these labs reside in today's documentation.  I personally reviewed and went over radiographic studies with the patient.  The results are noted within this dictation.  She is up to date on Mammogram.  Her next mammogram is due in Feb 2019.  I have reviewed her March 2017 bone density that demonstrates osteoporosis.  Problem list reviewed with patient and edited accordingly.  Medications are reviewed with the patient and edited accordingly.  Return in 6 months for follow-up with a breast exam.  More than 50% of the time spent with the patient was utilized for counseling and coordination of care.  Osteoporosis Osteoporosis, age related without fracture.  Intolerant to fosamax, Prolia was cost-prohibitive.   Her primary care provider is inquiring about Reclast which would certainly be an option.    I reviewed the risks, benefits, alternatives, and side effects of Reclast therapy including, but not limited to, hypocalcemia and osteonecrosis of the jaw.  She is advised to continue with calcium and vitamin D.  She is also advised to let her dentist know that she is on bisphosphonate therapy.  She is to avoid any elective oral surgery without updating medical oncology and her dentist about bisphosphonate therapy.  Labs today: CMET.  Will plan on starting Reclast next week.    Supportive therapy plan is built.  Urinary pain Urinary pain with a history of UTI.  Order is placed for UA with reflex and culture.   ORDERS PLACED FOR THIS ENCOUNTER: Orders Placed This Encounter  Procedures  . Urine culture  . CBC with Differential  . Comprehensive metabolic panel  . Urinalysis, Routine w reflex microscopic    MEDICATIONS PRESCRIBED THIS ENCOUNTER: Meds ordered this encounter  Medications  . tamoxifen (NOLVADEX) 20 MG tablet    Sig: Take 1 tablet (20  mg total) by mouth daily.    Dispense:  90 tablet    Refill:  1    Order Specific Question:   Supervising Provider    Answer:   Brunetta Genera [[id number]]    THERAPY PLAN:  NCCN guidelines recommends the following surveillance for invasive breast cancer (2.2017):  A. History and Physical exam 1-4 times per year as clinically appropriate for 5 years, then annually.  B. Periodic screening for changes in family history and referral to genetics counseling as indicated  C. Educate, monitor, and refer to lymphedema management.  D. Mammography every 12  months  E. Routine imaging of reconstructed breast is not indicated.  F. In the absence of clinical signs and symptoms suggestive of recurrent disease, there is no indication for laboratory or imaging studies for metastases screening.  G. Women on Tamoxifen: annual gynecologic assessment every 12 months if uterus is present.  H. Women on aromatase inhibitor or who experience ovarian failure secondary to treatment should have monitoring of bone health with a bone mineral density determination at baseline and periodically thereafter.  I. Assess and encourage adherence to adjuvant endocrine therapy.  J. Evidence suggests that active lifestyle, healthy diet, limited alcohol intake, and achieving and maintaining an ideal body weight (20-25 BMI) may lead to optimal breast cancer outcomes.   All questions were answered. The patient knows to call the clinic with any problems, questions or concerns. We can certainly see the patient much sooner if necessary.  Patient and plan discussed with Dr. Twana First and she is in agreement with the aforementioned.   This note is electronically signed by: Doy Mince 03/15/2017 4:07 PM

## 2017-03-17 LAB — URINE CULTURE

## 2017-03-26 ENCOUNTER — Encounter (HOSPITAL_BASED_OUTPATIENT_CLINIC_OR_DEPARTMENT_OTHER): Payer: Medicare Other

## 2017-03-26 VITALS — BP 134/67 | HR 80 | Temp 98.6°F | Resp 16

## 2017-03-26 DIAGNOSIS — M810 Age-related osteoporosis without current pathological fracture: Secondary | ICD-10-CM | POA: Diagnosis not present

## 2017-03-26 DIAGNOSIS — Z9189 Other specified personal risk factors, not elsewhere classified: Secondary | ICD-10-CM

## 2017-03-26 DIAGNOSIS — C50912 Malignant neoplasm of unspecified site of left female breast: Secondary | ICD-10-CM | POA: Diagnosis not present

## 2017-03-26 MED ORDER — SODIUM CHLORIDE 0.9 % IV SOLN: 5.0000 mg | Freq: Once | INTRAVENOUS | Status: DC

## 2017-03-26 MED ORDER — SODIUM CHLORIDE 0.9 % IV SOLN
Freq: Once | INTRAVENOUS | Status: AC
  Administered 2017-03-26: 14:00:00 via INTRAVENOUS

## 2017-03-26 MED ORDER — ZOLEDRONIC ACID 5 MG/100ML IV SOLN
5.0000 mg | Freq: Once | INTRAVENOUS | Status: AC
  Administered 2017-03-26: 5 mg via INTRAVENOUS
  Filled 2017-03-26: qty 100

## 2017-03-26 NOTE — Patient Instructions (Signed)
[place] at [facility] Discharge Instructions  RECOMMENDATIONS MADE BY THE CONSULTANT AND ANY TEST RESULTS WILL BE SENT TO YOUR REFERRING PHYSICIAN.  Reclast given today per orders Follow up as scheduled.  Thank you for choosing [place] at [facility] to provide your oncology and hematology care.  To afford each patient quality time with our provider, please arrive at least 15 minutes before your scheduled appointment time.    If you have a lab appointment with the [facility] please come in thru the  Main Entrance and check in at the main information desk  You need to re-schedule your appointment should you arrive 10 or more minutes late.  We strive to give you quality time with our providers, and arriving late affects you and other patients whose appointments are after yours.  Also, if you no show three or more times for appointments you may be dismissed from the clinic at the providers discretion.     Again, thank you for choosing [facility].  Our hope is that these requests will decrease the amount of time that you wait before being seen by our physicians.       _____________________________________________________________  Should you have questions after your visit to [facility], please contact our office at [phone] between the hours of 8:30 a.m. and 4:30 p.m.  Voicemails left after 4:30 p.m. will not be returned until the following business day.  For prescription refill requests, have your pharmacy contact our office.       Resources For Cancer Patients and their Caregivers ? American Cancer Society: Can assist with transportation, wigs, general needs, runs Look Good Feel Better.        ([phone] ? Cancer Care: Provides financial assistance, online support groups, medication/co-pay assistance.  1-800-813-HOPE 603-333-3305) ? [place] Assists [place] cancer  patients and their families through emotional , educational and financial support.  409-665-5097 ? [place] DSS Where to apply for food stamps, Medicaid and utility assistance. 684-079-8667 ? RCATS: Transportation to medical appointments. 930-842-8675 ? Social Security Administration: May apply for disability if have a Stage IV cancer. 620-754-6114 [phone] ? [employer], Disability and Transit Services: Assists with nutrition, care and transit needs. [place] Support Programs: @10RELATIVEDAYS @ > Cancer Support Group  2nd Tuesday of the month 1pm-2pm, Journey Room  > Creative Journey  3rd Tuesday of the month 1130am-1pm, Journey Room  > Look Good Feel Better  1st Wednesday of the month 10am-12 noon, Journey Room (Call [place] to register [phone])

## 2017-03-26 NOTE — Progress Notes (Signed)
Obtained consent for reclast.  Patient tolerated infusion well today. No problems. Vitals stable and discharged home from clinic ambulatory. Follow up as scheduled.

## 2017-04-14 DIAGNOSIS — H269 Unspecified cataract: Secondary | ICD-10-CM | POA: Diagnosis not present

## 2017-04-14 DIAGNOSIS — R262 Difficulty in walking, not elsewhere classified: Secondary | ICD-10-CM | POA: Diagnosis not present

## 2017-04-14 DIAGNOSIS — M1990 Unspecified osteoarthritis, unspecified site: Secondary | ICD-10-CM | POA: Diagnosis not present

## 2017-04-14 DIAGNOSIS — E7800 Pure hypercholesterolemia, unspecified: Secondary | ICD-10-CM | POA: Diagnosis not present

## 2017-04-14 DIAGNOSIS — I10 Essential (primary) hypertension: Secondary | ICD-10-CM | POA: Diagnosis not present

## 2017-04-14 DIAGNOSIS — Z8249 Family history of ischemic heart disease and other diseases of the circulatory system: Secondary | ICD-10-CM | POA: Diagnosis not present

## 2017-04-14 DIAGNOSIS — I639 Cerebral infarction, unspecified: Secondary | ICD-10-CM | POA: Diagnosis not present

## 2017-04-14 DIAGNOSIS — I517 Cardiomegaly: Secondary | ICD-10-CM | POA: Diagnosis not present

## 2017-04-14 DIAGNOSIS — G459 Transient cerebral ischemic attack, unspecified: Secondary | ICD-10-CM | POA: Diagnosis not present

## 2017-04-14 DIAGNOSIS — Z79899 Other long term (current) drug therapy: Secondary | ICD-10-CM | POA: Diagnosis not present

## 2017-04-14 DIAGNOSIS — R930 Abnormal findings on diagnostic imaging of skull and head, not elsewhere classified: Secondary | ICD-10-CM | POA: Diagnosis not present

## 2017-04-14 DIAGNOSIS — Z853 Personal history of malignant neoplasm of breast: Secondary | ICD-10-CM | POA: Diagnosis not present

## 2017-04-15 DIAGNOSIS — E7800 Pure hypercholesterolemia, unspecified: Secondary | ICD-10-CM | POA: Diagnosis not present

## 2017-04-15 DIAGNOSIS — G459 Transient cerebral ischemic attack, unspecified: Secondary | ICD-10-CM | POA: Diagnosis not present

## 2017-04-15 DIAGNOSIS — I639 Cerebral infarction, unspecified: Secondary | ICD-10-CM | POA: Diagnosis not present

## 2017-04-20 DIAGNOSIS — M549 Dorsalgia, unspecified: Secondary | ICD-10-CM | POA: Diagnosis not present

## 2017-04-20 DIAGNOSIS — I639 Cerebral infarction, unspecified: Secondary | ICD-10-CM | POA: Diagnosis not present

## 2017-04-20 DIAGNOSIS — Z853 Personal history of malignant neoplasm of breast: Secondary | ICD-10-CM | POA: Diagnosis not present

## 2017-05-13 DIAGNOSIS — I517 Cardiomegaly: Secondary | ICD-10-CM | POA: Diagnosis not present

## 2017-05-13 DIAGNOSIS — Z79899 Other long term (current) drug therapy: Secondary | ICD-10-CM | POA: Diagnosis not present

## 2017-05-13 DIAGNOSIS — C50912 Malignant neoplasm of unspecified site of left female breast: Secondary | ICD-10-CM | POA: Diagnosis not present

## 2017-05-13 DIAGNOSIS — D333 Benign neoplasm of cranial nerves: Secondary | ICD-10-CM | POA: Diagnosis not present

## 2017-05-13 DIAGNOSIS — Z8673 Personal history of transient ischemic attack (TIA), and cerebral infarction without residual deficits: Secondary | ICD-10-CM | POA: Diagnosis not present

## 2017-05-13 DIAGNOSIS — R42 Dizziness and giddiness: Secondary | ICD-10-CM | POA: Diagnosis not present

## 2017-05-13 DIAGNOSIS — R9431 Abnormal electrocardiogram [ECG] [EKG]: Secondary | ICD-10-CM | POA: Diagnosis not present

## 2017-05-14 DIAGNOSIS — D333 Benign neoplasm of cranial nerves: Secondary | ICD-10-CM | POA: Diagnosis not present

## 2017-05-17 ENCOUNTER — Other Ambulatory Visit (HOSPITAL_COMMUNITY): Payer: Self-pay | Admitting: Emergency Medicine

## 2017-05-17 DIAGNOSIS — C50912 Malignant neoplasm of unspecified site of left female breast: Secondary | ICD-10-CM

## 2017-05-17 MED ORDER — TAMOXIFEN CITRATE 20 MG PO TABS: 20.0000 mg | ORAL_TABLET | Freq: Every day | ORAL | 1 refills | Status: DC

## 2017-05-17 NOTE — Progress Notes (Signed)
Tamoxifen refilled to [facility] in [place]

## 2017-05-21 DIAGNOSIS — D333 Benign neoplasm of cranial nerves: Secondary | ICD-10-CM | POA: Diagnosis not present

## 2017-06-14 DIAGNOSIS — I10 Essential (primary) hypertension: Secondary | ICD-10-CM | POA: Diagnosis not present

## 2017-06-14 DIAGNOSIS — B029 Zoster without complications: Secondary | ICD-10-CM | POA: Diagnosis not present

## 2017-07-27 DIAGNOSIS — H00029 Hordeolum internum unspecified eye, unspecified eyelid: Secondary | ICD-10-CM | POA: Diagnosis not present

## 2017-07-27 DIAGNOSIS — H02725 Madarosis of left lower eyelid and periocular area: Secondary | ICD-10-CM | POA: Diagnosis not present

## 2017-07-27 DIAGNOSIS — H02724 Madarosis of left upper eyelid and periocular area: Secondary | ICD-10-CM | POA: Diagnosis not present

## 2017-07-27 DIAGNOSIS — Z961 Presence of intraocular lens: Secondary | ICD-10-CM | POA: Diagnosis not present

## 2017-08-09 ENCOUNTER — Other Ambulatory Visit: Payer: Self-pay

## 2017-08-09 NOTE — Patient Outreach (Signed)
[facility]) Care Management  08/09/2017  DAISI KENTNER 24-Dec-1940 [id number]   Medication Adherence call to Mrs. Mamta Rimmer the reason for this call is because Mrs. Baril is showing past due under [facility] Ins.on her Lisinopril 5 mg spoke to patient she said she already order this medication and she will pick up today.   [place] Management Direct Dial [phone]  Fax [phone] [email or web address]

## 2017-09-01 DIAGNOSIS — Z961 Presence of intraocular lens: Secondary | ICD-10-CM | POA: Diagnosis not present

## 2017-09-01 DIAGNOSIS — H26491 Other secondary cataract, right eye: Secondary | ICD-10-CM | POA: Diagnosis not present

## 2017-09-01 DIAGNOSIS — H00029 Hordeolum internum unspecified eye, unspecified eyelid: Secondary | ICD-10-CM | POA: Diagnosis not present

## 2017-09-01 DIAGNOSIS — H25812 Combined forms of age-related cataract, left eye: Secondary | ICD-10-CM | POA: Diagnosis not present

## 2017-09-03 DIAGNOSIS — Z23 Encounter for immunization: Secondary | ICD-10-CM | POA: Diagnosis not present

## 2017-09-15 ENCOUNTER — Encounter (HOSPITAL_COMMUNITY): Payer: Self-pay | Admitting: Oncology

## 2017-09-15 ENCOUNTER — Encounter (HOSPITAL_COMMUNITY): Payer: No Typology Code available for payment source | Attending: Oncology | Admitting: Oncology

## 2017-09-15 VITALS — BP 153/75 | HR 90 | Resp 16 | Ht <= 58 in | Wt 144.6 lb

## 2017-09-15 DIAGNOSIS — Z79810 Long term (current) use of selective estrogen receptor modulators (SERMs): Secondary | ICD-10-CM | POA: Diagnosis not present

## 2017-09-15 DIAGNOSIS — Z170 Estrogen receptor positive status [ER+]: Secondary | ICD-10-CM

## 2017-09-15 DIAGNOSIS — M818 Other osteoporosis without current pathological fracture: Secondary | ICD-10-CM | POA: Diagnosis not present

## 2017-09-15 DIAGNOSIS — C50912 Malignant neoplasm of unspecified site of left female breast: Secondary | ICD-10-CM

## 2017-09-15 NOTE — Progress Notes (Signed)
Pamela Percy, MD [place] [address]  Neoplasm of left breast, regional lymph node staging category pN0 per American Joint Committee on Cancer Staging Guidelines, 7th edition [facility]) - Plan: DG Bone Density, Comprehensive metabolic panel, CBC with Differential  Other osteoporosis without current pathological fracture - Plan: DG Bone Density, Comprehensive metabolic panel, CBC with Differential  CURRENT THERAPY: Tamoxifen 20 mg daily beginning on 01/05/2013.  Reclast annually beginning in April 2018.  INTERVAL HISTORY: Pamela Li 76 y.o. female returns for followup of invasive ductal carcinoma of left breast, Stage IA (T1CN0M0), S/P left lumpectomy and sentinel lymph node biopsy on 12/11/2012, ER/PR+.  No radiation therapy.  On anti-estrogen therapy with Tamoxifen beginning on 01/05/2013 for which she will take for 5 years. AND Osteoporosis, age-related without any fractures.  Intolerant to fosamax, Prolia was cost-prohibitive.  Planning to begin Reclast annually.    Neoplasm of left breast, regional lymph node staging category pN0 per American Joint Committee on Cancer Staging Guidelines, 7th edition [facility])   12/11/2012 Initial Diagnosis    Left Lumpectomy and sentinel node biopsy      01/05/2013 -  Anti-estrogen oral therapy    Tamoxifen 20 mg daily      02/05/2016 Imaging    DEXA t-score L femur -2.5 unchanged from 2014, OSTEOPOROSIS      01/06/2017 Mammogram    Negative for any signs of breast cancer      Patient presents today for follow up. She stated that she had a TIA/stroke in May 2018 when she was having problems writing. Her symptoms had resolved after 3 days. She went and had an MRI brain performed. She was not put on any medications including asa on discharge from ER in [place]. She did not have any focal weakness. Otherwise she has been doing well. She had her mammogram done in February 2018 in [place]. She continues to take calcium-vitamin D and tamoxifen  without any issues.  Review of Systems  Constitutional: Negative.  Negative for chills, fever and weight loss.  HENT: Negative.   Eyes: Negative.   Respiratory: Negative.  Negative for cough.   Cardiovascular: Negative.  Negative for chest pain.  Gastrointestinal: Negative.  Negative for blood in stool, constipation, diarrhea, melena, nausea and vomiting.  Genitourinary: Negative.   Musculoskeletal: Negative for joint pain.  Skin: Negative.   Neurological: Negative.  Negative for weakness.  Endo/Heme/Allergies: Negative.   Psychiatric/Behavioral: Negative.     Past Medical History:  Diagnosis Date  . Allergy   . Arthritis   . Breast cancer ([place])   . Neoplasm of left breast, regional lymph node staging category pN0 per American Joint Committee on Cancer Staging Guidelines, 7th edition ([place]) 08/11/2016  . Osteoporosis   . Overactive bladder     Past Surgical History:  Procedure Laterality Date  . ABDOMINAL HYSTERECTOMY     partial  . BREAST LUMPECTOMY    . CATARACT EXTRACTION W/PHACO Right 08/27/2016   Procedure: CATARACT EXTRACTION PHACO AND INTRAOCULAR LENS PLACEMENT RIGHT EYE CDE=3.78;  Surgeon: Tonny Branch, MD;  Location: [facility];  Service: Ophthalmology;  Laterality: Right;    Family History  Problem Relation Age of Onset  . Cancer Mother        melanoma  . Heart disease Father   . Heart disease Brother   . Heart disease Brother     Social History   Social History  . Marital status: Married    Spouse name:  N/A  . Number of children: N/A  . Years of education: N/A   Social History Main Topics  . Smoking status: Never Smoker  . Smokeless tobacco: Never Used  . Alcohol use Yes     Comment: once a month glass of wine with supper  . Drug use: No  . Sexual activity: Not Currently   Other Topics Concern  . None   Social History Narrative  . None     PHYSICAL EXAMINATION  ECOG PERFORMANCE STATUS: 1 - Symptomatic but completely ambulatory  Vitals:    09/15/17 1456  BP: (!) 153/75  Pulse: 90  Resp: 16  SpO2: 100%    GENERAL:alert, no distress, well nourished, well developed, comfortable, cooperative, smiling and unaccompanied SKIN: skin color, texture, turgor are normal, no rashes or significant lesions HEAD: Normocephalic, No masses, lesions, tenderness or abnormalities EYES: normal, EOMI, Conjunctiva are pink and non-injected EARS: External ears normal OROPHARYNX:lips, buccal mucosa, and tongue normal and mucous membranes are moist  NECK: supple, no adenopathy, trachea midline LYMPH:  no palpable lymphadenopathy BREAST: not examined LUNGS: clear to auscultation and percussion HEART: regular rate & rhythm, no murmurs, no gallops, S1 normal and S2 normal ABDOMEN:abdomen soft, non-tender and normal bowel sounds BACK: Back symmetric, no curvature. EXTREMITIES:less then 2 second capillary refill, no joint deformities, effusion, or inflammation, no skin discoloration, no cyanosis  NEURO: alert & oriented x 3 with fluent speech, no focal motor/sensory deficits, gait normal   LABORATORY DATA: CBC    Component Value Date/Time   WBC 8.1 03/15/2017 1527   RBC 3.84 (L) 03/15/2017 1527   HGB 12.4 03/15/2017 1527   HCT 37.5 03/15/2017 1527   PLT 255 03/15/2017 1527   MCV 97.7 03/15/2017 1527   MCH 32.3 03/15/2017 1527   MCHC 33.1 03/15/2017 1527   RDW 13.1 03/15/2017 1527   LYMPHSABS 2.3 03/15/2017 1527   MONOABS 0.5 03/15/2017 1527   EOSABS 0.0 03/15/2017 1527   BASOSABS 0.0 03/15/2017 1527      Chemistry      Component Value Date/Time   NA 137 03/15/2017 1527   K 3.7 03/15/2017 1527   CL 101 03/15/2017 1527   CO2 30 03/15/2017 1527   BUN 20 03/15/2017 1527   CREATININE 0.62 03/15/2017 1527      Component Value Date/Time   CALCIUM 9.0 03/15/2017 1527   ALKPHOS 40 03/15/2017 1527   AST 22 03/15/2017 1527   ALT 19 03/15/2017 1527   BILITOT 0.2 (L) 03/15/2017 1527              PENDING LABS:   RADIOGRAPHIC  STUDIES:               PATHOLOGY:    ASSESSMENT AND PLAN:  Neoplasm of left breast, regional lymph node staging category pN0 per American Joint Committee on Cancer Staging Guidelines, 7th edition [facility]) Invasive ductal carcinoma of left breast, Stage IA (T1CN0M0), S/P left lumpectomy and sentinel lymph node biopsy on 12/11/2012, ER/PR+.  No radiation therapy.  On anti-estrogen therapy with Tamoxifen beginning on 01/05/2013 for which she will take for 5 years.  -Clinically NED. Repeat mammogram in Feb 2019. -continue Tamoxifen, she will complete 5 years on 01/05/18. No AI due to her worsening osteoporosis.  Osteoporosis -Recommended for patient to get repeat DEXA next year, she said her PCP will order it and she'll have it done in [place]. -Continue annual reclast, next dose in April 2019. -Continue calcium vitamin D   RTC in 6 months  for follow up with labs.   ORDERS PLACED FOR THIS ENCOUNTER: Orders Placed This Encounter  Procedures  . DG Bone Density  . Comprehensive metabolic panel  . CBC with Differential    THERAPY PLAN:  NCCN guidelines recommends the following surveillance for invasive breast cancer (2.2017):  A. History and Physical exam 1-4 times per year as clinically appropriate for 5 years, then annually.  B. Periodic screening for changes in family history and referral to genetics counseling as indicated  C. Educate, monitor, and refer to lymphedema management.  D. Mammography every 12 months  E. Routine imaging of reconstructed breast is not indicated.  F. In the absence of clinical signs and symptoms suggestive of recurrent disease, there is no indication for laboratory or imaging studies for metastases screening.  G. Women on Tamoxifen: annual gynecologic assessment every 12 months if uterus is present.  H. Women on aromatase inhibitor or who experience ovarian failure secondary to treatment should have monitoring of bone health with a bone mineral density  determination at baseline and periodically thereafter.  I. Assess and encourage adherence to adjuvant endocrine therapy.  J. Evidence suggests that active lifestyle, healthy diet, limited alcohol intake, and achieving and maintaining an ideal body weight (20-25 BMI) may lead to optimal breast cancer outcomes.   All questions were answered. The patient knows to call the clinic with any problems, questions or concerns. We can certainly see the patient much sooner if necessary.   This note is electronically signed by: Twana First, MD 09/15/2017 3:22 PM

## 2017-09-16 DIAGNOSIS — M810 Age-related osteoporosis without current pathological fracture: Secondary | ICD-10-CM | POA: Diagnosis not present

## 2017-09-16 DIAGNOSIS — E7800 Pure hypercholesterolemia, unspecified: Secondary | ICD-10-CM | POA: Diagnosis not present

## 2017-09-16 DIAGNOSIS — I10 Essential (primary) hypertension: Secondary | ICD-10-CM | POA: Diagnosis not present

## 2017-09-16 DIAGNOSIS — Z0001 Encounter for general adult medical examination with abnormal findings: Secondary | ICD-10-CM | POA: Diagnosis not present

## 2017-09-20 DIAGNOSIS — Z0001 Encounter for general adult medical examination with abnormal findings: Secondary | ICD-10-CM | POA: Diagnosis not present

## 2017-09-20 DIAGNOSIS — M810 Age-related osteoporosis without current pathological fracture: Secondary | ICD-10-CM | POA: Diagnosis not present

## 2017-09-22 DIAGNOSIS — L639 Alopecia areata, unspecified: Secondary | ICD-10-CM | POA: Diagnosis not present

## 2017-09-22 DIAGNOSIS — L728 Other follicular cysts of the skin and subcutaneous tissue: Secondary | ICD-10-CM | POA: Diagnosis not present

## 2017-12-05 ENCOUNTER — Other Ambulatory Visit (HOSPITAL_COMMUNITY): Payer: Self-pay | Admitting: Oncology

## 2017-12-05 DIAGNOSIS — C50912 Malignant neoplasm of unspecified site of left female breast: Secondary | ICD-10-CM

## 2018-01-12 DIAGNOSIS — R922 Inconclusive mammogram: Secondary | ICD-10-CM | POA: Diagnosis not present

## 2018-01-12 DIAGNOSIS — Z08 Encounter for follow-up examination after completed treatment for malignant neoplasm: Secondary | ICD-10-CM | POA: Diagnosis not present

## 2018-01-12 DIAGNOSIS — Z853 Personal history of malignant neoplasm of breast: Secondary | ICD-10-CM | POA: Diagnosis not present

## 2018-01-21 DIAGNOSIS — J069 Acute upper respiratory infection, unspecified: Secondary | ICD-10-CM | POA: Diagnosis not present

## 2018-01-21 DIAGNOSIS — J0100 Acute maxillary sinusitis, unspecified: Secondary | ICD-10-CM | POA: Diagnosis not present

## 2018-01-21 DIAGNOSIS — Z6828 Body mass index (BMI) 28.0-28.9, adult: Secondary | ICD-10-CM | POA: Diagnosis not present

## 2018-01-21 DIAGNOSIS — R05 Cough: Secondary | ICD-10-CM | POA: Diagnosis not present

## 2018-01-24 DIAGNOSIS — M810 Age-related osteoporosis without current pathological fracture: Secondary | ICD-10-CM | POA: Diagnosis not present

## 2018-01-24 DIAGNOSIS — Z881 Allergy status to other antibiotic agents status: Secondary | ICD-10-CM | POA: Diagnosis not present

## 2018-01-24 DIAGNOSIS — E785 Hyperlipidemia, unspecified: Secondary | ICD-10-CM | POA: Diagnosis not present

## 2018-01-24 DIAGNOSIS — K219 Gastro-esophageal reflux disease without esophagitis: Secondary | ICD-10-CM | POA: Diagnosis not present

## 2018-01-24 DIAGNOSIS — M8580 Other specified disorders of bone density and structure, unspecified site: Secondary | ICD-10-CM | POA: Diagnosis not present

## 2018-01-24 DIAGNOSIS — Z1231 Encounter for screening mammogram for malignant neoplasm of breast: Secondary | ICD-10-CM | POA: Diagnosis not present

## 2018-01-24 DIAGNOSIS — Z170 Estrogen receptor positive status [ER+]: Secondary | ICD-10-CM | POA: Diagnosis not present

## 2018-01-24 DIAGNOSIS — Z79899 Other long term (current) drug therapy: Secondary | ICD-10-CM | POA: Diagnosis not present

## 2018-01-24 DIAGNOSIS — Z8673 Personal history of transient ischemic attack (TIA), and cerebral infarction without residual deficits: Secondary | ICD-10-CM | POA: Diagnosis not present

## 2018-01-24 DIAGNOSIS — C50912 Malignant neoplasm of unspecified site of left female breast: Secondary | ICD-10-CM | POA: Diagnosis not present

## 2018-01-24 DIAGNOSIS — I10 Essential (primary) hypertension: Secondary | ICD-10-CM | POA: Diagnosis not present

## 2018-02-01 DIAGNOSIS — M810 Age-related osteoporosis without current pathological fracture: Secondary | ICD-10-CM | POA: Diagnosis not present

## 2018-03-03 DIAGNOSIS — Z6828 Body mass index (BMI) 28.0-28.9, adult: Secondary | ICD-10-CM | POA: Diagnosis not present

## 2018-03-03 DIAGNOSIS — S30861A Insect bite (nonvenomous) of abdominal wall, initial encounter: Secondary | ICD-10-CM | POA: Diagnosis not present

## 2018-03-07 DIAGNOSIS — Z780 Asymptomatic menopausal state: Secondary | ICD-10-CM | POA: Diagnosis not present

## 2018-03-07 DIAGNOSIS — M85852 Other specified disorders of bone density and structure, left thigh: Secondary | ICD-10-CM | POA: Diagnosis not present

## 2018-03-07 DIAGNOSIS — M810 Age-related osteoporosis without current pathological fracture: Secondary | ICD-10-CM | POA: Diagnosis not present

## 2018-03-28 ENCOUNTER — Ambulatory Visit (HOSPITAL_COMMUNITY): Payer: Medicare Other

## 2018-03-28 ENCOUNTER — Other Ambulatory Visit (HOSPITAL_COMMUNITY): Payer: Medicare Other

## 2018-05-02 DIAGNOSIS — H25812 Combined forms of age-related cataract, left eye: Secondary | ICD-10-CM | POA: Diagnosis not present

## 2018-05-02 DIAGNOSIS — H26491 Other secondary cataract, right eye: Secondary | ICD-10-CM | POA: Diagnosis not present

## 2018-05-24 NOTE — Patient Instructions (Signed)
Your procedure is scheduled on: 06/06/2018  Report to [facility] at  75   AM.  Call this number if you have problems the morning of surgery: [phone]   Do not eat food or drink liquids :After Midnight.      Take these medicines the morning of surgery with A SIP OF WATER: allegra, lisinopril, meclazine, myrbetriq, tamoxifen.   Do not wear jewelry, make-up or nail polish.  Do not wear lotions, powders, or perfumes. You may wear deodorant.  Do not shave 48 hours prior to surgery.  Do not bring valuables to the hospital.  Contacts, dentures or bridgework may not be worn into surgery.  Leave suitcase in the car. After surgery it may be brought to your room.  For patients admitted to the hospital, checkout time is 11:00 AM the day of discharge.   Patients discharged the day of surgery will not be allowed to drive home.  :     Please read over the following fact sheets that you were given: Coughing and Deep Breathing, Surgical Site Infection Prevention, Anesthesia Post-op Instructions and Care and Recovery After Surgery    Cataract A cataract is a clouding of the lens of the eye. When a lens becomes cloudy, vision is reduced based on the degree and nature of the clouding. Many cataracts reduce vision to some degree. Some cataracts make people more near-sighted as they develop. Other cataracts increase glare. Cataracts that are ignored and become worse can sometimes look white. The white color can be seen through the pupil. CAUSES   Aging. However, cataracts may occur at any age, even in newborns.   Certain drugs.   Trauma to the eye.   Certain diseases such as diabetes.   Specific eye diseases such as chronic inflammation inside the eye or a sudden attack of a rare form of glaucoma.   Inherited or acquired medical problems.  SYMPTOMS   Gradual, progressive drop in vision in the affected eye.   Severe, rapid visual loss. This most often happens when trauma is the cause.  DIAGNOSIS    To detect a cataract, an eye doctor examines the lens. Cataracts are best diagnosed with an exam of the eyes with the pupils enlarged (dilated) by drops.  TREATMENT  For an early cataract, vision may improve by using different eyeglasses or stronger lighting. If that does not help your vision, surgery is the only effective treatment. A cataract needs to be surgically removed when vision loss interferes with your everyday activities, such as driving, reading, or watching TV. A cataract may also have to be removed if it prevents examination or treatment of another eye problem. Surgery removes the cloudy lens and usually replaces it with a substitute lens (intraocular lens, IOL).  At a time when both you and your doctor agree, the cataract will be surgically removed. If you have cataracts in both eyes, only one is usually removed at a time. This allows the operated eye to heal and be out of danger from any possible problems after surgery (such as infection or poor wound healing). In rare cases, a cataract may be doing damage to your eye. In these cases, your caregiver may advise surgical removal right away. The vast majority of people who have cataract surgery have better vision afterward. HOME CARE INSTRUCTIONS  If you are not planning surgery, you may be asked to do the following:  Use different eyeglasses.   Use stronger or brighter lighting.   Ask your eye doctor  about reducing your medicine dose or changing medicines if it is thought that a medicine caused your cataract. Changing medicines does not make the cataract go away on its own.   Become familiar with your surroundings. Poor vision can lead to injury. Avoid bumping into things on the affected side. You are at a higher risk for tripping or falling.   Exercise extreme care when driving or operating machinery.   Wear sunglasses if you are sensitive to bright light or experiencing problems with glare.  SEEK IMMEDIATE MEDICAL CARE IF:   You  have a worsening or sudden vision loss.   You notice redness, swelling, or increasing pain in the eye.   You have a fever.  Document Released: 11/16/2005 Document Revised: 11/05/2011 Document Reviewed: 07/10/2011 [facility] Patient Information 2012 [place].PATIENT INSTRUCTIONS POST-ANESTHESIA  IMMEDIATELY FOLLOWING SURGERY:  Do not drive or operate machinery for the first twenty four hours after surgery.  Do not make any important decisions for twenty four hours after surgery or while taking narcotic pain medications or sedatives.  If you develop intractable nausea and vomiting or a severe headache please notify your doctor immediately.  FOLLOW-UP:  Please make an appointment with your surgeon as instructed. You do not need to follow up with anesthesia unless specifically instructed to do so.  WOUND CARE INSTRUCTIONS (if applicable):  Keep a dry clean dressing on the anesthesia/puncture wound site if there is drainage.  Once the wound has quit draining you may leave it open to air.  Generally you should leave the bandage intact for twenty four hours unless there is drainage.  If the epidural site drains for more than 36-48 hours please call the anesthesia department.  QUESTIONS?:  Please feel free to call your physician or the hospital operator if you have any questions, and they will be happy to assist you.

## 2018-05-27 ENCOUNTER — Other Ambulatory Visit: Payer: Self-pay

## 2018-05-27 ENCOUNTER — Encounter (HOSPITAL_COMMUNITY)
Admission: RE | Admit: 2018-05-27 | Discharge: 2018-05-27 | Disposition: A | Discharge status: Home / Self Care | Payer: PPO | Source: Ambulatory Visit | Attending: Ophthalmology | Admitting: Ophthalmology

## 2018-05-27 ENCOUNTER — Encounter (HOSPITAL_COMMUNITY): Payer: Self-pay

## 2018-05-27 DIAGNOSIS — Z01812 Encounter for preprocedural laboratory examination: Secondary | ICD-10-CM | POA: Diagnosis not present

## 2018-05-27 DIAGNOSIS — Z01818 Encounter for other preprocedural examination: Secondary | ICD-10-CM | POA: Diagnosis not present

## 2018-05-27 HISTORY — DX: Essential (primary) hypertension: I10

## 2018-05-27 HISTORY — DX: Cerebral infarction, unspecified: I63.9

## 2018-05-27 HISTORY — DX: Pure hypercholesterolemia, unspecified: E78.00

## 2018-05-27 LAB — CBC WITH DIFFERENTIAL/PLATELET
Basophils Absolute: 0 10*3/uL (ref 0.0–0.1)
Basophils Relative: 0 %
Eosinophils Absolute: 0.1 10*3/uL (ref 0.0–0.7)
Eosinophils Relative: 1 %
HEMATOCRIT: 39.8 % (ref 36.0–46.0)
HEMOGLOBIN: 12.5 g/dL (ref 12.0–15.0)
LYMPHS ABS: 2.3 10*3/uL (ref 0.7–4.0)
LYMPHS PCT: 31 %
MCH: 31 pg (ref 26.0–34.0)
MCHC: 31.4 g/dL (ref 30.0–36.0)
MCV: 98.8 fL (ref 78.0–100.0)
MONO ABS: 0.4 10*3/uL (ref 0.1–1.0)
Monocytes Relative: 5 %
NEUTROS ABS: 4.7 10*3/uL (ref 1.7–7.7)
Neutrophils Relative %: 63 %
Platelets: 301 10*3/uL (ref 150–400)
RBC: 4.03 MIL/uL (ref 3.87–5.11)
RDW: 12.6 % (ref 11.5–15.5)
WBC: 7.5 10*3/uL (ref 4.0–10.5)

## 2018-05-27 LAB — BASIC METABOLIC PANEL
ANION GAP: 8 (ref 5–15)
BUN: 19 mg/dL (ref 8–23)
CALCIUM: 9.3 mg/dL (ref 8.9–10.3)
CO2: 29 mmol/L (ref 22–32)
CREATININE: 0.63 mg/dL (ref 0.44–1.00)
Chloride: 105 mmol/L (ref 98–111)
GFR calc Af Amer: >60 mL/min (ref >60)
GFR calc non Af Amer: >60 mL/min (ref >60)
GLUCOSE: 118 mg/dL — AB (ref 70–99)
Potassium: 3.7 mmol/L (ref 3.5–5.1)
Sodium: 142 mmol/L (ref 135–145)

## 2018-05-30 ENCOUNTER — Inpatient Hospital Stay (HOSPITAL_COMMUNITY)
Admission: RE | Admit: 2018-05-30 | Discharge: 2018-05-30 | Disposition: A | Discharge status: Home / Self Care | Payer: Medicare Other | Source: Ambulatory Visit

## 2018-06-06 ENCOUNTER — Other Ambulatory Visit: Payer: Self-pay

## 2018-06-06 ENCOUNTER — Encounter (HOSPITAL_COMMUNITY)
Admission: RE | Disposition: A | Discharge status: Home / Self Care | Payer: Self-pay | Source: Ambulatory Visit | Attending: Ophthalmology

## 2018-06-06 ENCOUNTER — Encounter (HOSPITAL_COMMUNITY): Payer: Self-pay | Admitting: *Deleted

## 2018-06-06 ENCOUNTER — Ambulatory Visit (HOSPITAL_COMMUNITY)
Admission: RE | Admit: 2018-06-06 | Discharge: 2018-06-06 | Disposition: A | Discharge status: Home / Self Care | Payer: PPO | Source: Ambulatory Visit | Attending: Ophthalmology | Admitting: Ophthalmology

## 2018-06-06 ENCOUNTER — Ambulatory Visit (HOSPITAL_COMMUNITY): Payer: PPO | Admitting: Anesthesiology

## 2018-06-06 DIAGNOSIS — E7800 Pure hypercholesterolemia, unspecified: Secondary | ICD-10-CM | POA: Insufficient documentation

## 2018-06-06 DIAGNOSIS — H25812 Combined forms of age-related cataract, left eye: Secondary | ICD-10-CM | POA: Diagnosis not present

## 2018-06-06 DIAGNOSIS — Z79899 Other long term (current) drug therapy: Secondary | ICD-10-CM | POA: Insufficient documentation

## 2018-06-06 DIAGNOSIS — I10 Essential (primary) hypertension: Secondary | ICD-10-CM | POA: Insufficient documentation

## 2018-06-06 DIAGNOSIS — M810 Age-related osteoporosis without current pathological fracture: Secondary | ICD-10-CM | POA: Insufficient documentation

## 2018-06-06 DIAGNOSIS — Z7982 Long term (current) use of aspirin: Secondary | ICD-10-CM | POA: Diagnosis not present

## 2018-06-06 DIAGNOSIS — H2512 Age-related nuclear cataract, left eye: Secondary | ICD-10-CM | POA: Diagnosis not present

## 2018-06-06 HISTORY — PX: CATARACT EXTRACTION W/PHACO: SHX586

## 2018-06-06 SURGERY — PHACOEMULSIFICATION, CATARACT, WITH IOL INSERTION
Anesthesia: Monitor Anesthesia Care | Site: Eye | Laterality: Left

## 2018-06-06 MED ORDER — BSS IO SOLN
INTRAOCULAR | Status: DC | PRN
  Administered 2018-06-06: 15 mL

## 2018-06-06 MED ORDER — MIDAZOLAM HCL 2 MG/2ML IJ SOLN
INTRAMUSCULAR | Status: AC
  Filled 2018-06-06: qty 2

## 2018-06-06 MED ORDER — PHENYLEPHRINE HCL 2.5 % OP SOLN
1.0000 [drp] | OPHTHALMIC | Status: AC
  Administered 2018-06-06 (×3): 1 [drp] via OPHTHALMIC

## 2018-06-06 MED ORDER — LIDOCAINE HCL 3.5 % OP GEL
1.0000 "application " | Freq: Once | OPHTHALMIC | Status: AC
  Administered 2018-06-06: 1 via OPHTHALMIC

## 2018-06-06 MED ORDER — MIDAZOLAM HCL 5 MG/5ML IJ SOLN
INTRAMUSCULAR | Status: DC | PRN
  Administered 2018-06-06 (×2): 1 mg via INTRAVENOUS

## 2018-06-06 MED ORDER — FENTANYL CITRATE (PF) 100 MCG/2ML IJ SOLN
INTRAMUSCULAR | Status: AC
  Filled 2018-06-06: qty 2

## 2018-06-06 MED ORDER — PROVISC 10 MG/ML IO SOLN
INTRAOCULAR | Status: DC | PRN
  Administered 2018-06-06: 0.85 mL via INTRAOCULAR

## 2018-06-06 MED ORDER — NEOMYCIN-POLYMYXIN-DEXAMETH 3.5-10000-0.1 OP SUSP
OPHTHALMIC | Status: DC | PRN
  Administered 2018-06-06: 2 [drp] via OPHTHALMIC

## 2018-06-06 MED ORDER — CYCLOPENTOLATE-PHENYLEPHRINE 0.2-1 % OP SOLN
1.0000 [drp] | OPHTHALMIC | Status: AC
  Administered 2018-06-06 (×3): 1 [drp] via OPHTHALMIC

## 2018-06-06 MED ORDER — LIDOCAINE HCL (PF) 1 % IJ SOLN
INTRAMUSCULAR | Status: DC | PRN
  Administered 2018-06-06: .6 mL

## 2018-06-06 MED ORDER — TETRACAINE HCL 0.5 % OP SOLN
1.0000 [drp] | OPHTHALMIC | Status: AC
  Administered 2018-06-06 (×3): 1 [drp] via OPHTHALMIC

## 2018-06-06 MED ORDER — POVIDONE-IODINE 5 % OP SOLN
OPHTHALMIC | Status: DC | PRN
  Administered 2018-06-06: 1 via OPHTHALMIC

## 2018-06-06 MED ORDER — EPINEPHRINE PF 1 MG/ML IJ SOLN
INTRAOCULAR | Status: DC | PRN
  Administered 2018-06-06: 500 mL

## 2018-06-06 MED ORDER — LACTATED RINGERS IV SOLN
INTRAVENOUS | Status: DC
  Administered 2018-06-06: 07:00:00 via INTRAVENOUS

## 2018-06-06 SURGICAL SUPPLY — 10 items
CLOTH BEACON ORANGE TIMEOUT ST (SAFETY) ×2 IMPLANT
EYE SHIELD UNIVERSAL CLEAR (GAUZE/BANDAGES/DRESSINGS) ×2 IMPLANT
GLOVE BIOGEL PI IND STRL 7.0 (GLOVE) ×2 IMPLANT
GLOVE BIOGEL PI INDICATOR 7.0 (GLOVE) ×2
LENS ALC ACRYL/TECN (Ophthalmic Related) ×2 IMPLANT
PAD ARMBOARD 7.5X6 YLW CONV (MISCELLANEOUS) ×2 IMPLANT
SYRINGE LUER LOK 1CC (MISCELLANEOUS) ×2 IMPLANT
TAPE SURG TRANSPORE 1 IN (GAUZE/BANDAGES/DRESSINGS) ×1 IMPLANT
TAPE SURGICAL TRANSPORE 1 IN (GAUZE/BANDAGES/DRESSINGS) ×1
WATER STERILE IRR 250ML POUR (IV SOLUTION) ×2 IMPLANT

## 2018-06-06 NOTE — Transfer of Care (Signed)
Immediate Anesthesia Transfer of Care Note  Patient: Pamela Li  Procedure(s) Performed: CATARACT EXTRACTION PHACO AND INTRAOCULAR LENS PLACEMENT (IOC) (Left Eye)  Patient Location: PACU  Anesthesia Type:MAC  Level of Consciousness: awake  Airway & Oxygen Therapy: Patient Spontanous Breathing  Post-op Assessment: Report given to RN  Post vital signs: Reviewed  Last Vitals:  Vitals Value Taken Time  BP    Temp    Pulse    Resp    SpO2      Last Pain:  Vitals:   06/06/18 0641  TempSrc: Oral  PainSc: 0-No pain      Patients Stated Pain Goal: 7 (73/[id number]/66 [id number])  Complications: No apparent anesthesia complications

## 2018-06-06 NOTE — H&P (Signed)
I have reviewed the H&P, the patient was re-examined, and I have identified no interval changes in medical condition and plan of care since the history and physical of record  

## 2018-06-06 NOTE — Discharge Instructions (Signed)

## 2018-06-06 NOTE — Op Note (Signed)
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  Assistants: None  Anesthesia: Topical with MAC  Indications: Painless, progressive loss of vision with compromise of daily activities.  Surgery: Cataract Extraction with Intraocular lens Implant Left Eye  Discription: The patient had dilating drops and viscous lidocaine placed into the Left eye in the pre-op holding area. After transfer to the operating room, a time out was performed. The patient was then prepped and draped. Beginning with a 27m blade a paracentesis port was made at the surgeon's 2 o'clock position. The anterior chamber was then filled with 1% non-preserved lidocaine. This was followed by filling the anterior chamber with Provisc.  A 2.465mkeratome blade was used to make a clear corneal incision at the temporal limbus.  A bent cystatome needle was used to create a continuous tear capsulotomy. Hydrodissection was performed with balanced salt solution on a Fine canula. The lens nucleus was then removed using the phacoemulsification handpiece. Residual cortex was removed with the I&A handpiece. The anterior chamber and capsular bag were refilled with Provisc. A posterior chamber intraocular lens was placed into the capsular bag with it's injector. The implant was positioned with the Kuglan hook. The Provisc was then removed from the anterior chamber and capsular bag with the I&A handpiece. Stromal hydration of the main incision and paracentesis port was performed with BSS on a Fine canula. The wounds were tested for leak which was negative. The patient tolerated the procedure well. There were no operative complications. The patient was then transferred to the recovery room in stable condition.  Complications: None  Specimen: None  EBL: None  Prosthetic device: J&J Technis, PCB00, power 22.0, SN  [id number]

## 2018-06-06 NOTE — Anesthesia Preprocedure Evaluation (Signed)
Anesthesia Evaluation  Patient identified by MRN, date of birth, ID band Patient awake    Reviewed: Allergy & Precautions, H&P , NPO status , Patient's Chart, lab work & pertinent test results, reviewed documented beta blocker date and time   Airway Mallampati: II  TM Distance: >3 FB Neck ROM: full    Dental no notable dental hx. (+) Upper Dentures, Lower Dentures   Pulmonary neg pulmonary ROS,    Pulmonary exam normal breath sounds clear to auscultation       Cardiovascular Exercise Tolerance: Good hypertension, On Medications negative cardio ROS   Rhythm:regular Rate:Normal     Neuro/Psych  Neuromuscular disease CVA negative neurological ROS  negative psych ROS   GI/Hepatic negative GI ROS, Neg liver ROS,   Endo/Other  negative endocrine ROS  Renal/GU negative Renal ROS  negative genitourinary   Musculoskeletal   Abdominal   Peds  Hematology negative hematology ROS (+)   Anesthesia Other Findings Returns for second Phaco/IOL.  No issues with first 1 yr s/p "light CVA attributed to HTN not previosuly tx Meclizine for "vertigo"  Reproductive/Obstetrics negative OB ROS                             Anesthesia Physical Anesthesia Plan  ASA: III  Anesthesia Plan: MAC   Post-op Pain Management:    Induction:   PONV Risk Score and Plan:   Airway Management Planned:   Additional Equipment:   Intra-op Plan:   Post-operative Plan:   Informed Consent: I have reviewed the patients History and Physical, chart, labs and discussed the procedure including the risks, benefits and alternatives for the proposed anesthesia with the patient or authorized representative who has indicated his/her understanding and acceptance.   Dental Advisory Given  Plan Discussed with: CRNA and Anesthesiologist  Anesthesia Plan Comments:         Anesthesia Quick Evaluation

## 2018-06-06 NOTE — Anesthesia Postprocedure Evaluation (Signed)
Anesthesia Post Note  Patient: Pamela Li  Procedure(s) Performed: CATARACT EXTRACTION PHACO AND INTRAOCULAR LENS PLACEMENT (IOC) (Left Eye)  Patient location during evaluation: Short Stay Anesthesia Type: MAC Level of consciousness: awake and alert and oriented Pain management: pain level controlled Vital Signs Assessment: post-procedure vital signs reviewed and stable Respiratory status: spontaneous breathing Cardiovascular status: stable and blood pressure returned to baseline Postop Assessment: no apparent nausea or vomiting Anesthetic complications: no     Last Vitals:  Vitals:   06/06/18 0641  BP: 138/74  Pulse: 79  Resp: (!) 21  Temp: 36.9 C  SpO2: 97%    Last Pain:  Vitals:   06/06/18 0641  TempSrc: Oral  PainSc: 0-No pain                 Ewelina Naves

## 2018-06-07 ENCOUNTER — Encounter (HOSPITAL_COMMUNITY): Payer: Self-pay | Admitting: Ophthalmology

## 2018-07-07 DIAGNOSIS — R42 Dizziness and giddiness: Secondary | ICD-10-CM | POA: Diagnosis not present

## 2018-07-07 DIAGNOSIS — Z8673 Personal history of transient ischemic attack (TIA), and cerebral infarction without residual deficits: Secondary | ICD-10-CM | POA: Diagnosis not present

## 2018-07-07 DIAGNOSIS — H9313 Tinnitus, bilateral: Secondary | ICD-10-CM | POA: Diagnosis not present

## 2018-07-07 DIAGNOSIS — H9191 Unspecified hearing loss, right ear: Secondary | ICD-10-CM | POA: Diagnosis not present

## 2018-07-19 DIAGNOSIS — H16143 Punctate keratitis, bilateral: Secondary | ICD-10-CM | POA: Diagnosis not present

## 2018-07-30 DIAGNOSIS — R42 Dizziness and giddiness: Secondary | ICD-10-CM | POA: Diagnosis not present

## 2018-08-19 DIAGNOSIS — R42 Dizziness and giddiness: Secondary | ICD-10-CM | POA: Diagnosis not present

## 2018-08-19 DIAGNOSIS — Z6829 Body mass index (BMI) 29.0-29.9, adult: Secondary | ICD-10-CM | POA: Diagnosis not present

## 2018-08-19 DIAGNOSIS — G459 Transient cerebral ischemic attack, unspecified: Secondary | ICD-10-CM | POA: Diagnosis not present

## 2018-08-19 DIAGNOSIS — Z23 Encounter for immunization: Secondary | ICD-10-CM | POA: Diagnosis not present

## 2018-08-19 DIAGNOSIS — K219 Gastro-esophageal reflux disease without esophagitis: Secondary | ICD-10-CM | POA: Diagnosis not present

## 2018-08-19 DIAGNOSIS — F411 Generalized anxiety disorder: Secondary | ICD-10-CM | POA: Diagnosis not present

## 2018-10-03 DIAGNOSIS — I10 Essential (primary) hypertension: Secondary | ICD-10-CM | POA: Diagnosis not present

## 2018-10-03 DIAGNOSIS — D519 Vitamin B12 deficiency anemia, unspecified: Secondary | ICD-10-CM | POA: Diagnosis not present

## 2018-10-03 DIAGNOSIS — Z85828 Personal history of other malignant neoplasm of skin: Secondary | ICD-10-CM | POA: Diagnosis not present

## 2018-10-03 DIAGNOSIS — E7800 Pure hypercholesterolemia, unspecified: Secondary | ICD-10-CM | POA: Diagnosis not present

## 2018-10-03 DIAGNOSIS — L570 Actinic keratosis: Secondary | ICD-10-CM | POA: Diagnosis not present

## 2018-10-03 DIAGNOSIS — L659 Nonscarring hair loss, unspecified: Secondary | ICD-10-CM | POA: Diagnosis not present

## 2018-10-07 DIAGNOSIS — I10 Essential (primary) hypertension: Secondary | ICD-10-CM | POA: Diagnosis not present

## 2018-10-07 DIAGNOSIS — Z1212 Encounter for screening for malignant neoplasm of rectum: Secondary | ICD-10-CM | POA: Diagnosis not present

## 2018-10-07 DIAGNOSIS — Z6829 Body mass index (BMI) 29.0-29.9, adult: Secondary | ICD-10-CM | POA: Diagnosis not present

## 2018-10-07 DIAGNOSIS — Z0001 Encounter for general adult medical examination with abnormal findings: Secondary | ICD-10-CM | POA: Diagnosis not present

## 2018-12-06 DIAGNOSIS — L570 Actinic keratosis: Secondary | ICD-10-CM | POA: Diagnosis not present

## 2018-12-06 DIAGNOSIS — L719 Rosacea, unspecified: Secondary | ICD-10-CM | POA: Diagnosis not present

## 2019-01-18 DIAGNOSIS — C50112 Malignant neoplasm of central portion of left female breast: Secondary | ICD-10-CM | POA: Diagnosis not present

## 2019-01-18 DIAGNOSIS — R928 Other abnormal and inconclusive findings on diagnostic imaging of breast: Secondary | ICD-10-CM | POA: Diagnosis not present

## 2019-01-18 DIAGNOSIS — Z853 Personal history of malignant neoplasm of breast: Secondary | ICD-10-CM | POA: Diagnosis not present

## 2019-01-18 DIAGNOSIS — Z170 Estrogen receptor positive status [ER+]: Secondary | ICD-10-CM | POA: Diagnosis not present

## 2019-01-24 DIAGNOSIS — M810 Age-related osteoporosis without current pathological fracture: Secondary | ICD-10-CM | POA: Diagnosis not present

## 2019-01-24 DIAGNOSIS — C50912 Malignant neoplasm of unspecified site of left female breast: Secondary | ICD-10-CM | POA: Diagnosis not present

## 2019-01-26 DIAGNOSIS — M8580 Other specified disorders of bone density and structure, unspecified site: Secondary | ICD-10-CM | POA: Diagnosis not present

## 2019-01-26 DIAGNOSIS — C50912 Malignant neoplasm of unspecified site of left female breast: Secondary | ICD-10-CM | POA: Diagnosis not present

## 2019-01-26 DIAGNOSIS — M810 Age-related osteoporosis without current pathological fracture: Secondary | ICD-10-CM | POA: Diagnosis not present

## 2019-02-02 DIAGNOSIS — M8580 Other specified disorders of bone density and structure, unspecified site: Secondary | ICD-10-CM | POA: Diagnosis not present

## 2019-06-06 DIAGNOSIS — L570 Actinic keratosis: Secondary | ICD-10-CM | POA: Diagnosis not present

## 2019-06-06 DIAGNOSIS — L821 Other seborrheic keratosis: Secondary | ICD-10-CM | POA: Diagnosis not present

## 2019-06-06 DIAGNOSIS — L738 Other specified follicular disorders: Secondary | ICD-10-CM | POA: Diagnosis not present

## 2019-06-06 DIAGNOSIS — L719 Rosacea, unspecified: Secondary | ICD-10-CM | POA: Diagnosis not present

## 2019-09-25 DIAGNOSIS — Z23 Encounter for immunization: Secondary | ICD-10-CM | POA: Diagnosis not present

## 2019-10-03 DIAGNOSIS — L570 Actinic keratosis: Secondary | ICD-10-CM | POA: Diagnosis not present

## 2019-10-03 DIAGNOSIS — L814 Other melanin hyperpigmentation: Secondary | ICD-10-CM | POA: Diagnosis not present

## 2019-10-03 DIAGNOSIS — L719 Rosacea, unspecified: Secondary | ICD-10-CM | POA: Diagnosis not present

## 2019-10-03 DIAGNOSIS — D0439 Carcinoma in situ of skin of other parts of face: Secondary | ICD-10-CM | POA: Diagnosis not present

## 2019-10-03 DIAGNOSIS — D485 Neoplasm of uncertain behavior of skin: Secondary | ICD-10-CM | POA: Diagnosis not present

## 2019-10-03 DIAGNOSIS — D1801 Hemangioma of skin and subcutaneous tissue: Secondary | ICD-10-CM | POA: Diagnosis not present

## 2019-10-03 DIAGNOSIS — L821 Other seborrheic keratosis: Secondary | ICD-10-CM | POA: Diagnosis not present

## 2019-10-05 DIAGNOSIS — R5382 Chronic fatigue, unspecified: Secondary | ICD-10-CM | POA: Diagnosis not present

## 2019-10-05 DIAGNOSIS — K219 Gastro-esophageal reflux disease without esophagitis: Secondary | ICD-10-CM | POA: Diagnosis not present

## 2019-10-05 DIAGNOSIS — I10 Essential (primary) hypertension: Secondary | ICD-10-CM | POA: Diagnosis not present

## 2019-10-05 DIAGNOSIS — E559 Vitamin D deficiency, unspecified: Secondary | ICD-10-CM | POA: Diagnosis not present

## 2019-10-05 DIAGNOSIS — E7800 Pure hypercholesterolemia, unspecified: Secondary | ICD-10-CM | POA: Diagnosis not present

## 2019-10-05 DIAGNOSIS — F411 Generalized anxiety disorder: Secondary | ICD-10-CM | POA: Diagnosis not present

## 2019-10-10 DIAGNOSIS — Z0001 Encounter for general adult medical examination with abnormal findings: Secondary | ICD-10-CM | POA: Diagnosis not present

## 2019-10-10 DIAGNOSIS — R05 Cough: Secondary | ICD-10-CM | POA: Diagnosis not present

## 2019-10-10 DIAGNOSIS — M25552 Pain in left hip: Secondary | ICD-10-CM | POA: Diagnosis not present

## 2019-10-10 DIAGNOSIS — I10 Essential (primary) hypertension: Secondary | ICD-10-CM | POA: Diagnosis not present

## 2019-10-10 DIAGNOSIS — Z6828 Body mass index (BMI) 28.0-28.9, adult: Secondary | ICD-10-CM | POA: Diagnosis not present

## 2019-10-10 DIAGNOSIS — C50912 Malignant neoplasm of unspecified site of left female breast: Secondary | ICD-10-CM | POA: Diagnosis not present

## 2019-10-10 DIAGNOSIS — M810 Age-related osteoporosis without current pathological fracture: Secondary | ICD-10-CM | POA: Diagnosis not present

## 2019-10-10 DIAGNOSIS — E782 Mixed hyperlipidemia: Secondary | ICD-10-CM | POA: Diagnosis not present

## 2019-10-12 DIAGNOSIS — C44329 Squamous cell carcinoma of skin of other parts of face: Secondary | ICD-10-CM | POA: Diagnosis not present

## 2019-10-20 ENCOUNTER — Other Ambulatory Visit: Payer: Self-pay

## 2019-12-19 DIAGNOSIS — J0100 Acute maxillary sinusitis, unspecified: Secondary | ICD-10-CM | POA: Diagnosis not present

## 2020-02-29 DIAGNOSIS — C50912 Malignant neoplasm of unspecified site of left female breast: Secondary | ICD-10-CM | POA: Diagnosis not present

## 2020-02-29 DIAGNOSIS — E559 Vitamin D deficiency, unspecified: Secondary | ICD-10-CM | POA: Diagnosis not present

## 2020-02-29 DIAGNOSIS — M810 Age-related osteoporosis without current pathological fracture: Secondary | ICD-10-CM | POA: Diagnosis not present

## 2020-03-05 DIAGNOSIS — Z1231 Encounter for screening mammogram for malignant neoplasm of breast: Secondary | ICD-10-CM | POA: Diagnosis not present

## 2020-03-05 DIAGNOSIS — M810 Age-related osteoporosis without current pathological fracture: Secondary | ICD-10-CM | POA: Diagnosis not present

## 2020-03-05 DIAGNOSIS — C50912 Malignant neoplasm of unspecified site of left female breast: Secondary | ICD-10-CM | POA: Diagnosis not present

## 2020-03-20 DIAGNOSIS — M8580 Other specified disorders of bone density and structure, unspecified site: Secondary | ICD-10-CM | POA: Diagnosis not present

## 2020-05-01 DIAGNOSIS — K219 Gastro-esophageal reflux disease without esophagitis: Secondary | ICD-10-CM | POA: Diagnosis not present

## 2020-05-01 DIAGNOSIS — E782 Mixed hyperlipidemia: Secondary | ICD-10-CM | POA: Diagnosis not present

## 2020-05-01 DIAGNOSIS — E7800 Pure hypercholesterolemia, unspecified: Secondary | ICD-10-CM | POA: Diagnosis not present

## 2020-05-01 DIAGNOSIS — I10 Essential (primary) hypertension: Secondary | ICD-10-CM | POA: Diagnosis not present

## 2020-05-03 DIAGNOSIS — F411 Generalized anxiety disorder: Secondary | ICD-10-CM | POA: Diagnosis not present

## 2020-05-03 DIAGNOSIS — M169 Osteoarthritis of hip, unspecified: Secondary | ICD-10-CM | POA: Diagnosis not present

## 2020-05-03 DIAGNOSIS — R42 Dizziness and giddiness: Secondary | ICD-10-CM | POA: Diagnosis not present

## 2020-05-03 DIAGNOSIS — K219 Gastro-esophageal reflux disease without esophagitis: Secondary | ICD-10-CM | POA: Diagnosis not present

## 2020-05-03 DIAGNOSIS — I679 Cerebrovascular disease, unspecified: Secondary | ICD-10-CM | POA: Diagnosis not present

## 2020-05-03 DIAGNOSIS — Z6828 Body mass index (BMI) 28.0-28.9, adult: Secondary | ICD-10-CM | POA: Diagnosis not present

## 2020-05-03 DIAGNOSIS — M79673 Pain in unspecified foot: Secondary | ICD-10-CM | POA: Diagnosis not present

## 2020-05-14 DIAGNOSIS — L728 Other follicular cysts of the skin and subcutaneous tissue: Secondary | ICD-10-CM | POA: Diagnosis not present

## 2020-05-14 DIAGNOSIS — L821 Other seborrheic keratosis: Secondary | ICD-10-CM | POA: Diagnosis not present

## 2020-05-14 DIAGNOSIS — L570 Actinic keratosis: Secondary | ICD-10-CM | POA: Diagnosis not present

## 2020-05-14 DIAGNOSIS — L719 Rosacea, unspecified: Secondary | ICD-10-CM | POA: Diagnosis not present

## 2020-05-16 DIAGNOSIS — Z1231 Encounter for screening mammogram for malignant neoplasm of breast: Secondary | ICD-10-CM | POA: Diagnosis not present

## 2020-06-05 DIAGNOSIS — R922 Inconclusive mammogram: Secondary | ICD-10-CM | POA: Diagnosis not present

## 2020-06-05 DIAGNOSIS — N6321 Unspecified lump in the left breast, upper outer quadrant: Secondary | ICD-10-CM | POA: Diagnosis not present

## 2020-06-05 DIAGNOSIS — Z853 Personal history of malignant neoplasm of breast: Secondary | ICD-10-CM | POA: Diagnosis not present

## 2020-06-05 DIAGNOSIS — N6489 Other specified disorders of breast: Secondary | ICD-10-CM | POA: Diagnosis not present

## 2020-07-02 DIAGNOSIS — H00022 Hordeolum internum right lower eyelid: Secondary | ICD-10-CM | POA: Diagnosis not present

## 2020-07-03 DIAGNOSIS — C50412 Malignant neoplasm of upper-outer quadrant of left female breast: Secondary | ICD-10-CM | POA: Diagnosis not present

## 2020-07-03 DIAGNOSIS — C50212 Malignant neoplasm of upper-inner quadrant of left female breast: Secondary | ICD-10-CM | POA: Diagnosis not present

## 2020-07-03 DIAGNOSIS — N6321 Unspecified lump in the left breast, upper outer quadrant: Secondary | ICD-10-CM | POA: Diagnosis not present

## 2020-07-03 DIAGNOSIS — Z170 Estrogen receptor positive status [ER+]: Secondary | ICD-10-CM | POA: Diagnosis not present

## 2020-07-09 DIAGNOSIS — Z170 Estrogen receptor positive status [ER+]: Secondary | ICD-10-CM | POA: Diagnosis not present

## 2020-07-09 DIAGNOSIS — Z7189 Other specified counseling: Secondary | ICD-10-CM | POA: Diagnosis not present

## 2020-07-09 DIAGNOSIS — C50912 Malignant neoplasm of unspecified site of left female breast: Secondary | ICD-10-CM | POA: Diagnosis not present

## 2020-07-09 DIAGNOSIS — C50412 Malignant neoplasm of upper-outer quadrant of left female breast: Secondary | ICD-10-CM | POA: Diagnosis not present

## 2020-07-15 DIAGNOSIS — C50912 Malignant neoplasm of unspecified site of left female breast: Secondary | ICD-10-CM | POA: Diagnosis not present

## 2020-07-24 DIAGNOSIS — E785 Hyperlipidemia, unspecified: Secondary | ICD-10-CM | POA: Diagnosis not present

## 2020-07-24 DIAGNOSIS — Z20822 Contact with and (suspected) exposure to covid-19: Secondary | ICD-10-CM | POA: Diagnosis not present

## 2020-07-24 DIAGNOSIS — Z882 Allergy status to sulfonamides status: Secondary | ICD-10-CM | POA: Diagnosis not present

## 2020-07-24 DIAGNOSIS — Z8673 Personal history of transient ischemic attack (TIA), and cerebral infarction without residual deficits: Secondary | ICD-10-CM | POA: Diagnosis not present

## 2020-07-24 DIAGNOSIS — I10 Essential (primary) hypertension: Secondary | ICD-10-CM | POA: Diagnosis not present

## 2020-07-24 DIAGNOSIS — Z170 Estrogen receptor positive status [ER+]: Secondary | ICD-10-CM | POA: Diagnosis not present

## 2020-07-24 DIAGNOSIS — C50912 Malignant neoplasm of unspecified site of left female breast: Secondary | ICD-10-CM | POA: Diagnosis not present

## 2020-07-24 DIAGNOSIS — C50412 Malignant neoplasm of upper-outer quadrant of left female breast: Secondary | ICD-10-CM | POA: Diagnosis not present

## 2020-07-24 DIAGNOSIS — I2510 Atherosclerotic heart disease of native coronary artery without angina pectoris: Secondary | ICD-10-CM | POA: Diagnosis not present

## 2020-08-09 DIAGNOSIS — Z7189 Other specified counseling: Secondary | ICD-10-CM | POA: Diagnosis not present

## 2020-08-09 DIAGNOSIS — R897 Abnormal histological findings in specimens from other organs, systems and tissues: Secondary | ICD-10-CM | POA: Diagnosis not present

## 2020-08-09 DIAGNOSIS — C50912 Malignant neoplasm of unspecified site of left female breast: Secondary | ICD-10-CM | POA: Diagnosis not present

## 2020-08-13 DIAGNOSIS — Z170 Estrogen receptor positive status [ER+]: Secondary | ICD-10-CM | POA: Diagnosis not present

## 2020-08-13 DIAGNOSIS — C50912 Malignant neoplasm of unspecified site of left female breast: Secondary | ICD-10-CM | POA: Diagnosis not present

## 2020-08-22 DIAGNOSIS — Z9012 Acquired absence of left breast and nipple: Secondary | ICD-10-CM | POA: Diagnosis not present

## 2020-08-22 DIAGNOSIS — Z7952 Long term (current) use of systemic steroids: Secondary | ICD-10-CM | POA: Diagnosis not present

## 2020-08-22 DIAGNOSIS — M1610 Unilateral primary osteoarthritis, unspecified hip: Secondary | ICD-10-CM | POA: Diagnosis not present

## 2020-08-22 DIAGNOSIS — K219 Gastro-esophageal reflux disease without esophagitis: Secondary | ICD-10-CM | POA: Diagnosis not present

## 2020-08-22 DIAGNOSIS — G8929 Other chronic pain: Secondary | ICD-10-CM | POA: Diagnosis not present

## 2020-08-22 DIAGNOSIS — Z79811 Long term (current) use of aromatase inhibitors: Secondary | ICD-10-CM | POA: Diagnosis not present

## 2020-08-22 DIAGNOSIS — Z7189 Other specified counseling: Secondary | ICD-10-CM | POA: Diagnosis not present

## 2020-08-22 DIAGNOSIS — Z8673 Personal history of transient ischemic attack (TIA), and cerebral infarction without residual deficits: Secondary | ICD-10-CM | POA: Diagnosis not present

## 2020-08-22 DIAGNOSIS — M810 Age-related osteoporosis without current pathological fracture: Secondary | ICD-10-CM | POA: Diagnosis not present

## 2020-08-22 DIAGNOSIS — E785 Hyperlipidemia, unspecified: Secondary | ICD-10-CM | POA: Diagnosis not present

## 2020-08-22 DIAGNOSIS — C50412 Malignant neoplasm of upper-outer quadrant of left female breast: Secondary | ICD-10-CM | POA: Diagnosis not present

## 2020-08-22 DIAGNOSIS — Z170 Estrogen receptor positive status [ER+]: Secondary | ICD-10-CM | POA: Diagnosis not present

## 2020-08-22 DIAGNOSIS — C50912 Malignant neoplasm of unspecified site of left female breast: Secondary | ICD-10-CM | POA: Diagnosis not present

## 2020-08-22 DIAGNOSIS — F419 Anxiety disorder, unspecified: Secondary | ICD-10-CM | POA: Diagnosis not present

## 2020-08-22 DIAGNOSIS — I10 Essential (primary) hypertension: Secondary | ICD-10-CM | POA: Diagnosis not present

## 2020-08-22 DIAGNOSIS — Z23 Encounter for immunization: Secondary | ICD-10-CM | POA: Diagnosis not present

## 2020-08-22 DIAGNOSIS — M8580 Other specified disorders of bone density and structure, unspecified site: Secondary | ICD-10-CM | POA: Diagnosis not present

## 2020-08-26 DIAGNOSIS — C50912 Malignant neoplasm of unspecified site of left female breast: Secondary | ICD-10-CM | POA: Diagnosis not present

## 2020-08-28 DIAGNOSIS — C50912 Malignant neoplasm of unspecified site of left female breast: Secondary | ICD-10-CM | POA: Diagnosis not present

## 2020-09-02 DIAGNOSIS — Z9012 Acquired absence of left breast and nipple: Secondary | ICD-10-CM | POA: Diagnosis not present

## 2020-09-02 DIAGNOSIS — Z5181 Encounter for therapeutic drug level monitoring: Secondary | ICD-10-CM | POA: Diagnosis not present

## 2020-09-02 DIAGNOSIS — C50412 Malignant neoplasm of upper-outer quadrant of left female breast: Secondary | ICD-10-CM | POA: Diagnosis not present

## 2020-09-02 DIAGNOSIS — Z7189 Other specified counseling: Secondary | ICD-10-CM | POA: Diagnosis not present

## 2020-09-02 DIAGNOSIS — G8929 Other chronic pain: Secondary | ICD-10-CM | POA: Diagnosis not present

## 2020-09-02 DIAGNOSIS — M810 Age-related osteoporosis without current pathological fracture: Secondary | ICD-10-CM | POA: Diagnosis not present

## 2020-09-02 DIAGNOSIS — M8580 Other specified disorders of bone density and structure, unspecified site: Secondary | ICD-10-CM | POA: Diagnosis not present

## 2020-09-02 DIAGNOSIS — Z8673 Personal history of transient ischemic attack (TIA), and cerebral infarction without residual deficits: Secondary | ICD-10-CM | POA: Diagnosis not present

## 2020-09-02 DIAGNOSIS — Z170 Estrogen receptor positive status [ER+]: Secondary | ICD-10-CM | POA: Diagnosis not present

## 2020-09-02 DIAGNOSIS — Z79811 Long term (current) use of aromatase inhibitors: Secondary | ICD-10-CM | POA: Diagnosis not present

## 2020-09-02 DIAGNOSIS — I10 Essential (primary) hypertension: Secondary | ICD-10-CM | POA: Diagnosis not present

## 2020-09-02 DIAGNOSIS — Z7952 Long term (current) use of systemic steroids: Secondary | ICD-10-CM | POA: Diagnosis not present

## 2020-09-02 DIAGNOSIS — F419 Anxiety disorder, unspecified: Secondary | ICD-10-CM | POA: Diagnosis not present

## 2020-09-02 DIAGNOSIS — E785 Hyperlipidemia, unspecified: Secondary | ICD-10-CM | POA: Diagnosis not present

## 2020-09-02 DIAGNOSIS — Z23 Encounter for immunization: Secondary | ICD-10-CM | POA: Diagnosis not present

## 2020-09-02 DIAGNOSIS — K219 Gastro-esophageal reflux disease without esophagitis: Secondary | ICD-10-CM | POA: Diagnosis not present

## 2020-09-02 DIAGNOSIS — Z79899 Other long term (current) drug therapy: Secondary | ICD-10-CM | POA: Diagnosis not present

## 2020-09-02 DIAGNOSIS — C50912 Malignant neoplasm of unspecified site of left female breast: Secondary | ICD-10-CM | POA: Diagnosis not present

## 2020-09-02 DIAGNOSIS — M1610 Unilateral primary osteoarthritis, unspecified hip: Secondary | ICD-10-CM | POA: Diagnosis not present

## 2020-09-03 DIAGNOSIS — C50912 Malignant neoplasm of unspecified site of left female breast: Secondary | ICD-10-CM | POA: Diagnosis not present

## 2020-09-10 DIAGNOSIS — C50912 Malignant neoplasm of unspecified site of left female breast: Secondary | ICD-10-CM | POA: Diagnosis not present

## 2020-09-17 DIAGNOSIS — C50912 Malignant neoplasm of unspecified site of left female breast: Secondary | ICD-10-CM | POA: Diagnosis not present

## 2020-09-20 DIAGNOSIS — C50912 Malignant neoplasm of unspecified site of left female breast: Secondary | ICD-10-CM | POA: Diagnosis not present

## 2020-09-24 DIAGNOSIS — M8580 Other specified disorders of bone density and structure, unspecified site: Secondary | ICD-10-CM | POA: Diagnosis not present

## 2020-09-24 DIAGNOSIS — Z7189 Other specified counseling: Secondary | ICD-10-CM | POA: Diagnosis not present

## 2020-09-24 DIAGNOSIS — Z5181 Encounter for therapeutic drug level monitoring: Secondary | ICD-10-CM | POA: Diagnosis not present

## 2020-09-24 DIAGNOSIS — Z79899 Other long term (current) drug therapy: Secondary | ICD-10-CM | POA: Diagnosis not present

## 2020-09-24 DIAGNOSIS — C50912 Malignant neoplasm of unspecified site of left female breast: Secondary | ICD-10-CM | POA: Diagnosis not present

## 2020-09-25 DIAGNOSIS — C50912 Malignant neoplasm of unspecified site of left female breast: Secondary | ICD-10-CM | POA: Diagnosis not present

## 2020-09-30 DIAGNOSIS — M1610 Unilateral primary osteoarthritis, unspecified hip: Secondary | ICD-10-CM | POA: Diagnosis not present

## 2020-09-30 DIAGNOSIS — Z23 Encounter for immunization: Secondary | ICD-10-CM | POA: Diagnosis not present

## 2020-09-30 DIAGNOSIS — Z170 Estrogen receptor positive status [ER+]: Secondary | ICD-10-CM | POA: Diagnosis not present

## 2020-09-30 DIAGNOSIS — G8929 Other chronic pain: Secondary | ICD-10-CM | POA: Diagnosis not present

## 2020-09-30 DIAGNOSIS — M8580 Other specified disorders of bone density and structure, unspecified site: Secondary | ICD-10-CM | POA: Diagnosis not present

## 2020-09-30 DIAGNOSIS — C50912 Malignant neoplasm of unspecified site of left female breast: Secondary | ICD-10-CM | POA: Diagnosis not present

## 2020-09-30 DIAGNOSIS — K219 Gastro-esophageal reflux disease without esophagitis: Secondary | ICD-10-CM | POA: Diagnosis not present

## 2020-09-30 DIAGNOSIS — Z9012 Acquired absence of left breast and nipple: Secondary | ICD-10-CM | POA: Diagnosis not present

## 2020-09-30 DIAGNOSIS — C50412 Malignant neoplasm of upper-outer quadrant of left female breast: Secondary | ICD-10-CM | POA: Diagnosis not present

## 2020-09-30 DIAGNOSIS — M810 Age-related osteoporosis without current pathological fracture: Secondary | ICD-10-CM | POA: Diagnosis not present

## 2020-09-30 DIAGNOSIS — I10 Essential (primary) hypertension: Secondary | ICD-10-CM | POA: Diagnosis not present

## 2020-09-30 DIAGNOSIS — E785 Hyperlipidemia, unspecified: Secondary | ICD-10-CM | POA: Diagnosis not present

## 2020-09-30 DIAGNOSIS — Z7952 Long term (current) use of systemic steroids: Secondary | ICD-10-CM | POA: Diagnosis not present

## 2020-09-30 DIAGNOSIS — F419 Anxiety disorder, unspecified: Secondary | ICD-10-CM | POA: Diagnosis not present

## 2020-09-30 DIAGNOSIS — Z79811 Long term (current) use of aromatase inhibitors: Secondary | ICD-10-CM | POA: Diagnosis not present

## 2020-09-30 DIAGNOSIS — Z8673 Personal history of transient ischemic attack (TIA), and cerebral infarction without residual deficits: Secondary | ICD-10-CM | POA: Diagnosis not present

## 2020-10-14 DIAGNOSIS — M810 Age-related osteoporosis without current pathological fracture: Secondary | ICD-10-CM | POA: Diagnosis not present

## 2020-10-14 DIAGNOSIS — E2839 Other primary ovarian failure: Secondary | ICD-10-CM | POA: Diagnosis not present

## 2020-10-29 DIAGNOSIS — E7849 Other hyperlipidemia: Secondary | ICD-10-CM | POA: Diagnosis not present

## 2020-10-29 DIAGNOSIS — M810 Age-related osteoporosis without current pathological fracture: Secondary | ICD-10-CM | POA: Diagnosis not present

## 2020-10-29 DIAGNOSIS — I10 Essential (primary) hypertension: Secondary | ICD-10-CM | POA: Diagnosis not present
